# Patient Record
Sex: Male | Born: 1975
Health system: Southern US, Community
[De-identification: ages and names within clinical notes are randomized; demographics above are authoritative.]

---

## 1998-09-17 ENCOUNTER — Emergency Department (HOSPITAL_COMMUNITY): Admission: EM | Admit: 1998-09-17 | Discharge: 1998-09-17 | Payer: Self-pay

## 2000-09-24 ENCOUNTER — Emergency Department (HOSPITAL_COMMUNITY): Admission: EM | Admit: 2000-09-24 | Discharge: 2000-09-24 | Payer: Self-pay | Admitting: Emergency Medicine

## 2003-11-04 ENCOUNTER — Emergency Department (HOSPITAL_COMMUNITY): Admission: EM | Admit: 2003-11-04 | Discharge: 2003-11-04 | Payer: Self-pay | Admitting: Emergency Medicine

## 2005-12-26 ENCOUNTER — Emergency Department (HOSPITAL_COMMUNITY): Admission: EM | Admit: 2005-12-26 | Discharge: 2005-12-26 | Payer: Self-pay | Admitting: Emergency Medicine

## 2006-05-31 ENCOUNTER — Emergency Department (HOSPITAL_COMMUNITY): Admission: EM | Admit: 2006-05-31 | Discharge: 2006-05-31 | Payer: Self-pay | Admitting: Emergency Medicine

## 2008-06-10 ENCOUNTER — Emergency Department (HOSPITAL_COMMUNITY): Admission: EM | Admit: 2008-06-10 | Discharge: 2008-06-10 | Payer: Self-pay | Admitting: Emergency Medicine

## 2008-07-08 ENCOUNTER — Ambulatory Visit (HOSPITAL_BASED_OUTPATIENT_CLINIC_OR_DEPARTMENT_OTHER): Admission: RE | Admit: 2008-07-08 | Discharge: 2008-07-08 | Payer: Self-pay | Admitting: Orthopaedic Surgery

## 2010-08-03 NOTE — Op Note (Signed)
NAMETARRANCE, JANUSZEWSKI                ACCOUNT NO.:  [id number]   MEDICAL RECORD NO.:  [id number]          PATIENT TYPE:  AMB   LOCATION:                               FACILITY:  [facility]   PHYSICIAN:  Lubertha Basque. Dalldorf, M.D.DATE OF BIRTH:  [birth year]   DATE OF PROCEDURE:  07/08/2008  DATE OF DISCHARGE:                               OPERATIVE REPORT   PREOPERATIVE DIAGNOSIS:  Left ankle fracture.   POSTOPERATIVE DIAGNOSIS:  Left ankle fracture.   PROCEDURE:  Left ankle open reduction and internal fixation.   ANESTHESIA:  General.   ATTENDING SURGEON:  Lubertha Basque. Jerl Santos, MD   ASSISTANT:  Lindwood Qua, PA   INDICATIONS FOR PROCEDURE:  The patient is a 35 year old man about a  month from an ankle fracture.  This was a vertical medial malleolus  fracture which was relatively nondisplaced.  Despite nonweightbearing  status, his fracture line has actually worsened.  With his fracture  pattern, he is offered ORIF in hopes of getting this to heal.  He is  also encouraged to stop smoking.  Informed operative consent was  obtained after discussion of possible complications including reaction  to anesthesia, infection, and nonunion.   SUMMARY, FINDINGS, AND PROCEDURE:  Under general anesthesia through a  small medial incision, a vertical medial malleolus fracture was exposed  and stabilized with an antiglide plate, made with a one-third tubular  Synthes plate.  This was secured with 3 cancellous screws.  I used  fluoroscopy throughout the case to make appropriate intraoperative  decisions and read all these views myself.   DESCRIPTION OF PROCEDURE:  The patient was taken tot he operating suite  where general anesthetic was applied without difficulty.  He was  positioned supine and prepped, draped in a normal sterile fashion.  After administration of IV Kefzol, the left leg was elevated,  exsanguinated, and tourniquet was inflated about the calf.  A small  medial incision was made from  the medial malleolus proximal.  Dissection  was carried down through the subcutaneous tissues and periosteum to  expose the fracture site.  We then contoured an one-third tubular plate  to fit on this bone in antiglide fashion.  I secured 1 screw proximal to  the fracture site and then 2 distal compressing appropriately.  I used  fluoroscopy to confirm adequate placement of hardware and read these  views myself.  The wound was irrigated followed by release of  tourniquet.  A small amount of bleeding was easily controlled with some  pressure and Bovie cautery.  Subcutaneous tissues were reapproximated  with 2-0 undyed Vicryl and skin was closed with staples.  Adaptic was  applied after local anesthetic was injected.  We then placed a dry  dressing and a posterior splint of plaster with ankle in a neutral  position.  Estimated blood loss and intraoperative fluids can be  obtained from anesthesia records as can accurate tourniquet time.   DISPOSITION:  The patient was extubated in the operating room and taken  to the recovery room in stable addition.  He was to go home on same  day  and follow up in my office next week.  I will contact him by phone  tonight.      Lubertha Basque Jerl Santos, M.D.  Electronically Signed     PGD/MEDQ  D:  07/08/2008  T:  07/09/2008  Job:  [id number]

## 2013-03-21 HISTORY — PX: ANKLE SURGERY: SHX546

## 2013-09-14 ENCOUNTER — Emergency Department: Payer: Self-pay | Admitting: Internal Medicine

## 2014-09-10 ENCOUNTER — Ambulatory Visit (INDEPENDENT_AMBULATORY_CARE_PROVIDER_SITE_OTHER): Payer: Self-pay

## 2014-09-10 ENCOUNTER — Ambulatory Visit (INDEPENDENT_AMBULATORY_CARE_PROVIDER_SITE_OTHER): Payer: Self-pay | Admitting: Family Medicine

## 2014-09-10 VITALS — BP 126/72 | HR 72 | Temp 98.7°F | Resp 17 | Ht 70.5 in | Wt 207.2 lb

## 2014-09-10 DIAGNOSIS — M79641 Pain in right hand: Secondary | ICD-10-CM

## 2014-09-10 DIAGNOSIS — S62609A Fracture of unspecified phalanx of unspecified finger, initial encounter for closed fracture: Secondary | ICD-10-CM

## 2014-09-10 DIAGNOSIS — S80811A Abrasion, right lower leg, initial encounter: Secondary | ICD-10-CM

## 2014-09-10 MED ORDER — TRAMADOL HCL 50 MG PO TABS: 50.0000 mg | ORAL_TABLET | Freq: Three times a day (TID) | ORAL | Status: DC | PRN

## 2014-09-10 NOTE — Progress Notes (Signed)
Ulnar gutter splint application per Dr. Paralee Cancel verbal order. Deliah Boston, MS, PA-C   6:14 PM, 09/10/2014

## 2014-09-10 NOTE — Progress Notes (Signed)
Subjective:    Patient ID: Gary Burton, male    DOB: 1975/08/04, 39 y.o.   MRN: [id number] This chart was scribed for Meredith Staggers, MD by Littie Deeds, Medical Scribe. This patient was seen in Room 7 and the patient's care was started at 4:57 PM.    HPI HPI Comments: Gary Burton is a 39 y.o. right hand dominant male who presents to the [facility] for an injury, possible broken finger and abrasion on his right leg. Patient was playing softball last night when a softball struck his hand in the area between the 4th and 5th fingers. He has associated pain and swelling to the area. He has not tried any ice or medications for the pain. He is able to move his fingers, but with pain. Patient denies numbness. He has had prior injuries to both of his hands, including broken 4th knuckle of his right hand, but he has not had any prior finger injuries.  Patient also has an abrasion to his right leg due to sliding on the ground while playing softball last night. He does not have any difficulties moving the leg and is not concerned about the abrasion. He has applied an ointment to the area.  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  Review of Systems  Musculoskeletal: Positive for joint swelling and arthralgias.  Skin: Positive for wound.  Neurological: Negative for numbness.       Objective:   Physical Exam  Constitutional: He is oriented to person, place, and time. He appears well-developed and well-nourished. No distress.  HENT:  Head: Normocephalic and atraumatic.  Mouth/Throat: Oropharynx is clear and moist. No oropharyngeal exudate.  Eyes: Pupils are equal, round, and reactive to light.  Neck: Neck supple.  Cardiovascular: Normal rate.   Capillary refill < 1 second.  Pulmonary/Chest: Effort normal.  Musculoskeletal: He exhibits no edema.  Right hand: ecchymoses and swelling at base of 4th and 5th fingers, primarily at the base of the 5th and the distal  right 5th metacarpal. Able to move wrist without difficulty. No wrist bony tenderness. 1st-4th fingers non-tender. 1st-4th metacarpals non-tender. Tender along the right 5th finger. Able to flex and extend at the PIP, but guarded due to pain. Most tender over PIP proximal phalanx into the MCP of his 5th finger. Guarded at MCP and PIP of 5th finger.   Full ROM of right knee. Full strength of lower extremity, including ankle and foot.  Neurological: He is alert and oriented to person, place, and time. No cranial nerve deficit.  NVI distally.  Skin: Skin is warm and dry. No rash noted. No erythema.  Right lateral leg: 7 cm x 4 cm superficial abrasion with slight crust and dried blood centrally. No deep component. No wounds amenable to repair. No surrounding erythema of wound.  Fingertips warm. Skin intact.  Psychiatric: He has a normal mood and affect. His behavior is normal.  Vitals reviewed.  [facility] (PRIMARY) x-ray report read by Dr. Neva Seat: Right hand - spiral fracture which appears to extend to 20-30% of joint at MCP of the 5th finger without significant displacement.    Filed Vitals:   09/10/14 1622  BP: 126/72  Pulse: 72  Temp: 98.7 F (37.1 C)  TempSrc: Oral  Resp: 17  Height: 5' 10.5" (1.791 m)  Weight: 207 lb 3.2 oz (93.985 kg)  SpO2: 99%       Assessment & Plan:   Gary Burton is a  39 y.o. male Pain of right hand - Plan: DG Hand Complete Right, traMADol (ULTRAM) 50 MG tablet Finger fracture, right, closed, initial encounter - Plan: traMADol (ULTRAM) 50 MG tablet, Ambulatory referral to Hand Surgery  - oblique fracture with apparent involvement of MCP joint.  Due to risk of instability - refer to hand/ortho for eval and treatment. Placed in ulnar gutter splint, sx care discussed and tramadol if needed.   Abrasion, lower leg, anterior, right, initial encounter  - sx care, no sign of infection seen at present. RTC precautions.    Meds ordered this encounter  Medications    . traMADol (ULTRAM) 50 MG tablet    Sig: Take 1 tablet (50 mg total) by mouth every 8 (eight) hours as needed.    Dispense:  20 tablet    Refill:  0   Patient Instructions  For your finger fracture, I will refer you to hand surgeon. Ice on and off 15 minutes , elevate.  Ultram if needed.   Finger Fracture (Phalangeal) A broken bone of the finger (phalangealfracture) is a common injury for athletes. A single injury (trauma) is likely to fracture multiple bones on the same or different fingers. SYMPTOMS   Severe pain, at the time of injury.  Pain, tenderness, swelling, and later bruising of the finger and then the hand.  Visible deformity, if the fracture is complete and the bone fragments separate enough to distort the normal shape.  Numbness or coldness from swelling in the finger, causing pressure on blood vessels or nerves (uncommon). CAUSES  Direct or indirect injury (trauma) to the finger.  RISK INCREASES WITH:   Contact sports (football, rugby) or other sports where injury to the hand is likely (soccer, baseball, basketball).  Sports that require hitting (boxing, martial arts).  History of bone or joint disease, such as osteoporosis, or previous bone restraint.  Poor hand strength and flexibility. PREVENTION   For contact sports, wear appropriate and properly fitted protective equipment for the hand.  Learn and use proper technique when hitting, punching, or landing after a fall.  If you had a previous finger injury or hand restraint, use tape or padding to protect the finger when playing sports where finger injury is likely. PROGNOSIS  With proper treatment and normal alignment of the bones, healing can usually be expected in 4 to 6 weeks. Sometimes, surgery is needed.  RELATED COMPLICATIONS   Fracture does not heal (nonunion).  Bone heals in wrong position (malunion).  Chronic pain, stiffness, or swelling of the hand.  Excessive bleeding, causing pressure on  nerves and blood vessels.  Unstable or arthritic joint, following repeated injury or delayed treatment.  Hindrance of normal growth in children.  Infection in skin broken over the fracture (open fracture) or at the incision or pin sites from surgery.  Shortening of injured bones.  Bony bumps or loss of shape of the fingers.  Arthritic or stiff finger joint, if the fracture reaches the joint. TREATMENT  If the bones are properly aligned, treatment involves ice and medicine to reduce pain and inflammation. Then, the finger is restrained for 4 or more weeks, to allow for healing. If the fracture is out of alignment (displaced), involves more than one bone, or involves a joint, surgery is usually advised. Surgery often involves placing removable pins, screws, and sometimes plates, to hold the bones in proper alignment. After restraint (with or without surgery), stretching and strengthening exercises are needed. Exercises may be completed at home or with a  therapist. For certain sports, wearing a splint or having the finger taped during future activity is advised.  MEDICATION   If pain medicine is needed, nonsteroidal anti-inflammatory medicines (aspirin and ibuprofen), or other minor pain relievers (acetaminophen), are often advised.  Do not take pain medicine for 7 days before surgery.  Prescription pain relievers are usually prescribed only after surgery. Use only as directed and only as much as you need. COLD THERAPY   Cold treatment (icing) relieves pain and reduces inflammation. Cold treatment should be applied for 10 to 15 minutes every 2 to 3 hours, and immediately after activity that aggravates your symptoms. Use ice packs or an ice massage. SEEK MEDICAL CARE IF:   Pain, tenderness, or swelling gets worse, despite treatment.  You experience pain, numbness, or coldness in the hand.  Blue, gray, or dark color appears in the fingernails.  Any of the following occur after surgery:  fever, increased pain, swelling, redness, drainage of fluids, or bleeding in the affected area.  New, unexplained symptoms develop. (Drugs used in treatment may produce side effects.) Document Released: 03/07/2005 Document Revised: 05/30/2011 Document Reviewed: 06/19/2008 [facility] Patient Information 2015 [place], [place]. This information is not intended to replace advice given to you by your health care provider. Make sure you discuss any questions you have with your health care provider.    For your leg abrasion:  Abrasion An abrasion is a cut or scrape of the skin. Abrasions do not extend through all layers of the skin and most heal within 10 days. It is important to care for your abrasion properly to prevent infection. CAUSES  Most abrasions are caused by falling on, or gliding across, the ground or other surface. When your skin rubs on something, the outer and inner layer of skin rubs off, causing an abrasion. DIAGNOSIS  Your caregiver will be able to diagnose an abrasion during a physical exam.  TREATMENT  Your treatment depends on how large and deep the abrasion is. Generally, your abrasion will be cleaned with water and a mild soap to remove any dirt or debris. An antibiotic ointment may be put over the abrasion to prevent an infection. A bandage (dressing) may be wrapped around the abrasion to keep it from getting dirty.  You may need a tetanus shot if:  You cannot remember when you had your last tetanus shot.  You have never had a tetanus shot.  The injury broke your skin. If you get a tetanus shot, your arm may swell, get red, and feel warm to the touch. This is common and not a problem. If you need a tetanus shot and you choose not to have one, there is a rare chance of getting tetanus. Sickness from tetanus can be serious.  HOME CARE INSTRUCTIONS   If a dressing was applied, change it at least once a day or as directed by your caregiver. If the bandage sticks, soak it off  with warm water.   Wash the area with water and a mild soap to remove all the ointment 2 times a day. Rinse off the soap and pat the area dry with a clean towel.   Reapply any ointment as directed by your caregiver. This will help prevent infection and keep the bandage from sticking. Use gauze over the wound and under the dressing to help keep the bandage from sticking.   Change your dressing right away if it becomes wet or dirty.   Only take over-the-counter or prescription medicines for pain, discomfort,  or fever as directed by your caregiver.   Follow up with your caregiver within 24-48 hours for a wound check, or as directed. If you were not given a wound-check appointment, look closely at your abrasion for redness, swelling, or pus. These are signs of infection. SEEK IMMEDIATE MEDICAL CARE IF:   You have increasing pain in the wound.   You have redness, swelling, or tenderness around the wound.   You have pus coming from the wound.   You have a fever or persistent symptoms for more than 2-3 days.  You have a fever and your symptoms suddenly get worse.  You have a bad smell coming from the wound or dressing.  MAKE SURE YOU:   Understand these instructions.  Will watch your condition.  Will get help right away if you are not doing well or get worse. Document Released: 12/15/2004 Document Revised: 02/22/2012 Document Reviewed: 02/08/2011 [facility] Patient Information 2015 [place], [place]. This information is not intended to replace advice given to you by your health care provider. Make sure you discuss any questions you have with your health care provider.     I personally performed the services described in this documentation, which was scribed in my presence. The recorded information has been reviewed and considered, and addended by me as needed.

## 2014-09-10 NOTE — Patient Instructions (Addendum)
For your finger fracture, I will refer you to hand surgeon. Ice on and off 15 minutes , elevate.  Ultram if needed.   Finger Fracture (Phalangeal) A broken bone of the finger (phalangealfracture) is a common injury for athletes. A single injury (trauma) is likely to fracture multiple bones on the same or different fingers. SYMPTOMS   Severe pain, at the time of injury.  Pain, tenderness, swelling, and later bruising of the finger and then the hand.  Visible deformity, if the fracture is complete and the bone fragments separate enough to distort the normal shape.  Numbness or coldness from swelling in the finger, causing pressure on blood vessels or nerves (uncommon). CAUSES  Direct or indirect injury (trauma) to the finger.  RISK INCREASES WITH:   Contact sports (football, rugby) or other sports where injury to the hand is likely (soccer, baseball, basketball).  Sports that require hitting (boxing, martial arts).  History of bone or joint disease, such as osteoporosis, or previous bone restraint.  Poor hand strength and flexibility. PREVENTION   For contact sports, wear appropriate and properly fitted protective equipment for the hand.  Learn and use proper technique when hitting, punching, or landing after a fall.  If you had a previous finger injury or hand restraint, use tape or padding to protect the finger when playing sports where finger injury is likely. PROGNOSIS  With proper treatment and normal alignment of the bones, healing can usually be expected in 4 to 6 weeks. Sometimes, surgery is needed.  RELATED COMPLICATIONS   Fracture does not heal (nonunion).  Bone heals in wrong position (malunion).  Chronic pain, stiffness, or swelling of the hand.  Excessive bleeding, causing pressure on nerves and blood vessels.  Unstable or arthritic joint, following repeated injury or delayed treatment.  Hindrance of normal growth in children.  Infection in skin broken over  the fracture (open fracture) or at the incision or pin sites from surgery.  Shortening of injured bones.  Bony bumps or loss of shape of the fingers.  Arthritic or stiff finger joint, if the fracture reaches the joint. TREATMENT  If the bones are properly aligned, treatment involves ice and medicine to reduce pain and inflammation. Then, the finger is restrained for 4 or more weeks, to allow for healing. If the fracture is out of alignment (displaced), involves more than one bone, or involves a joint, surgery is usually advised. Surgery often involves placing removable pins, screws, and sometimes plates, to hold the bones in proper alignment. After restraint (with or without surgery), stretching and strengthening exercises are needed. Exercises may be completed at home or with a therapist. For certain sports, wearing a splint or having the finger taped during future activity is advised.  MEDICATION   If pain medicine is needed, nonsteroidal anti-inflammatory medicines (aspirin and ibuprofen), or other minor pain relievers (acetaminophen), are often advised.  Do not take pain medicine for 7 days before surgery.  Prescription pain relievers are usually prescribed only after surgery. Use only as directed and only as much as you need. COLD THERAPY   Cold treatment (icing) relieves pain and reduces inflammation. Cold treatment should be applied for 10 to 15 minutes every 2 to 3 hours, and immediately after activity that aggravates your symptoms. Use ice packs or an ice massage. SEEK MEDICAL CARE IF:   Pain, tenderness, or swelling gets worse, despite treatment.  You experience pain, numbness, or coldness in the hand.  Blue, gray, or dark color appears in the  fingernails.  Any of the following occur after surgery: fever, increased pain, swelling, redness, drainage of fluids, or bleeding in the affected area.  New, unexplained symptoms develop. (Drugs used in treatment may produce side  effects.) Document Released: 03/07/2005 Document Revised: 05/30/2011 Document Reviewed: 06/19/2008 [facility] Patient Information 2015 [place], [place]. This information is not intended to replace advice given to you by your health care provider. Make sure you discuss any questions you have with your health care provider.    For your leg abrasion:  Abrasion An abrasion is a cut or scrape of the skin. Abrasions do not extend through all layers of the skin and most heal within 10 days. It is important to care for your abrasion properly to prevent infection. CAUSES  Most abrasions are caused by falling on, or gliding across, the ground or other surface. When your skin rubs on something, the outer and inner layer of skin rubs off, causing an abrasion. DIAGNOSIS  Your caregiver will be able to diagnose an abrasion during a physical exam.  TREATMENT  Your treatment depends on how large and deep the abrasion is. Generally, your abrasion will be cleaned with water and a mild soap to remove any dirt or debris. An antibiotic ointment may be put over the abrasion to prevent an infection. A bandage (dressing) may be wrapped around the abrasion to keep it from getting dirty.  You may need a tetanus shot if:  You cannot remember when you had your last tetanus shot.  You have never had a tetanus shot.  The injury broke your skin. If you get a tetanus shot, your arm may swell, get red, and feel warm to the touch. This is common and not a problem. If you need a tetanus shot and you choose not to have one, there is a rare chance of getting tetanus. Sickness from tetanus can be serious.  HOME CARE INSTRUCTIONS   If a dressing was applied, change it at least once a day or as directed by your caregiver. If the bandage sticks, soak it off with warm water.   Wash the area with water and a mild soap to remove all the ointment 2 times a day. Rinse off the soap and pat the area dry with a clean towel.   Reapply  any ointment as directed by your caregiver. This will help prevent infection and keep the bandage from sticking. Use gauze over the wound and under the dressing to help keep the bandage from sticking.   Change your dressing right away if it becomes wet or dirty.   Only take over-the-counter or prescription medicines for pain, discomfort, or fever as directed by your caregiver.   Follow up with your caregiver within 24-48 hours for a wound check, or as directed. If you were not given a wound-check appointment, look closely at your abrasion for redness, swelling, or pus. These are signs of infection. SEEK IMMEDIATE MEDICAL CARE IF:   You have increasing pain in the wound.   You have redness, swelling, or tenderness around the wound.   You have pus coming from the wound.   You have a fever or persistent symptoms for more than 2-3 days.  You have a fever and your symptoms suddenly get worse.  You have a bad smell coming from the wound or dressing.  MAKE SURE YOU:   Understand these instructions.  Will watch your condition.  Will get help right away if you are not doing well or get worse. Document  Released: 12/15/2004 Document Revised: 02/22/2012 Document Reviewed: 02/08/2011 [facility] Patient Information 2015 [place], [place]. This information is not intended to replace advice given to you by your health care provider. Make sure you discuss any questions you have with your health care provider.

## 2017-01-19 ENCOUNTER — Ambulatory Visit: Payer: Self-pay | Admitting: Family Medicine

## 2017-01-19 ENCOUNTER — Ambulatory Visit (INDEPENDENT_AMBULATORY_CARE_PROVIDER_SITE_OTHER): Payer: 59 | Admitting: Physician Assistant

## 2017-01-19 VITALS — BP 124/78 | HR 73 | Temp 98.1°F | Resp 16 | Ht 70.0 in | Wt 212.0 lb

## 2017-01-19 DIAGNOSIS — N5082 Scrotal pain: Secondary | ICD-10-CM | POA: Diagnosis not present

## 2017-01-19 LAB — POCT URINALYSIS DIP (MANUAL ENTRY)
Bilirubin, UA: NEGATIVE
Blood, UA: NEGATIVE
Glucose, UA: NEGATIVE mg/dL
LEUKOCYTES UA: NEGATIVE
NITRITE UA: NEGATIVE
PH UA: 5.5 (ref 5.0–8.0)
PROTEIN UA: NEGATIVE mg/dL
Spec Grav, UA: >=1.03 — AB (ref 1.010–1.025)
UROBILINOGEN UA: 0.2 U/dL

## 2017-01-19 NOTE — Progress Notes (Signed)
[facility] AT [facility] [address], [place] [id number] [phone]  Date:  01/19/2017   Name:  Gary SawyerBrian J Hipps   DOB:  1975-10-24   MRN:  [id number]  PCP:  Patient, No Pcp Per    History of Present Illness:  Gary Burton is a 41 y.o. male patient who presents to PCP with  Chief Complaint  Patient presents with  . Testicle Pain    left side/ x 2 days, pt states he has a small knot on his testicle but not enlarged     2 days ago, he found a knot on his testicle.  Feels like a pea on the left side of testicle.  It is not swollen or enlarged.  There is pain when he touches it.  Underneath the sac, there is painful.  There is some pain at the right side of his groin.     Mother with breast cancer.     There are no active problems to display for this patient.   No past medical history on file.  Past Surgical History:  Procedure Laterality Date  . ANKLE SURGERY  2015   left ankle     Social History  Substance Use Topics  . Smoking status: Current Every Day Smoker    Packs/day: 0.50    Types: Cigarettes  . Smokeless tobacco: Not on file  . Alcohol use Not on file    No family history on file.  No Known Allergies  Medication list has been reviewed and updated.  No current outpatient prescriptions on file prior to visit.   No current facility-administered medications on file prior to visit.     ROS ROS otherwise unremarkable unless listed above.  Physical Examination: BP 124/78   Pulse 73   Temp 98.1 F (36.7 C) (Oral)   Resp 16   Ht 5\' 10"  (1.778 m)   Wt 212 lb (96.2 kg)   SpO2 97%   BMI 30.42 kg/m  Ideal Body Weight: Weight in (lb) to have BMI = 25: 173.9  Physical Exam  Constitutional: He is oriented to person, place, and time. He appears well-developed and well-nourished. No distress.  HENT:  Head: Normocephalic and atraumatic.  Eyes: Conjunctivae and EOM are normal. Pupils are equal, round, and reactive to light.  Cardiovascular: Normal  rate.  Pulmonary/Chest: Effort normal. No respiratory distress.  Genitourinary: Penis normal. Right testis shows no swelling. Left testis shows no swelling.  Genitourinary Comments: Testicular tenderness is minimal.  At the superior area of the testicle at the epididymis is a tender nodule.  Rubbery in form.    Neurological: He is alert and oriented to person, place, and time.  Skin: Skin is warm and dry. He is not diaphoretic.  Psychiatric: He has a normal mood and affect. His behavior is normal.     Assessment and Plan: Gary SawyerBrian J Sypher is a 41 y.o. male who is here today for cc of  Chief Complaint  Patient presents with  . Testicle Pain    left side/ x 2 days, pt states he has a small knot on his testicle but not enlarged   Scrotum pain - Plan: US Scrotum, GC/Chlamydia Probe Amp, POCT urinalysis dipstick  Trena PlattStephanie Shivan Hodes, PA-C [facility] 11/6/201811:37 AM

## 2017-01-19 NOTE — Patient Instructions (Addendum)
  I will contact you to get the ultrasound asap. Please await referral for urology.      IF you received an x-ray today, you will receive an invoice from [facility]. Please contact [facility] at [phone] with questions or concerns regarding your invoice.   IF you received labwork today, you will receive an invoice from [facility]. Please contact [facility] at [phone] with questions or concerns regarding your invoice.   Our billing staff will not be able to assist you with questions regarding bills from these companies.  You will be contacted with the lab results as soon as they are available. The fastest way to get your results is to activate your My Chart account. Instructions are located on the last page of this paperwork. If you have not heard from us regarding the results in 2 weeks, please contact this office.

## 2017-01-20 LAB — GC/CHLAMYDIA PROBE AMP
CHLAMYDIA, DNA PROBE: NEGATIVE
Neisseria gonorrhoeae by PCR: NEGATIVE

## 2017-01-24 ENCOUNTER — Telehealth: Payer: Self-pay | Admitting: Physician Assistant

## 2017-01-24 NOTE — Telephone Encounter (Signed)
Can I get this scheduled and performed please.

## 2017-01-24 NOTE — Telephone Encounter (Signed)
Just faxed order to [facility] they should have it before this afternoon

## 2017-02-01 ENCOUNTER — Other Ambulatory Visit: Payer: Self-pay

## 2017-02-01 ENCOUNTER — Ambulatory Visit (INDEPENDENT_AMBULATORY_CARE_PROVIDER_SITE_OTHER): Payer: 59 | Admitting: Emergency Medicine

## 2017-02-01 ENCOUNTER — Encounter: Payer: Self-pay | Admitting: *Deleted

## 2017-02-01 ENCOUNTER — Encounter: Payer: Self-pay | Admitting: Emergency Medicine

## 2017-02-01 ENCOUNTER — Telehealth: Payer: Self-pay | Admitting: General Practice

## 2017-02-01 VITALS — BP 124/70 | HR 70 | Temp 98.6°F | Resp 16 | Ht 68.75 in | Wt 212.8 lb

## 2017-02-01 DIAGNOSIS — N50812 Left testicular pain: Secondary | ICD-10-CM

## 2017-02-01 DIAGNOSIS — R5383 Other fatigue: Secondary | ICD-10-CM | POA: Diagnosis not present

## 2017-02-01 MED ORDER — CIPROFLOXACIN HCL 500 MG PO TABS: 500.0000 mg | ORAL_TABLET | Freq: Two times a day (BID) | ORAL | 0 refills | Status: AC

## 2017-02-01 NOTE — Telephone Encounter (Signed)
Pt had gone to [place] for discomfort and swelling in his groin. Now the discomfort has increased. Appointment made.

## 2017-02-01 NOTE — Patient Instructions (Addendum)
Scrotal ultrasound tomorrow as scheduled. Cipro as prescribed. Follow up with Urologist.   IF you received an x-ray today, you will receive an invoice from [facility]. Please contact [facility] at [phone] with questions or concerns regarding your invoice.   IF you received labwork today, you will receive an invoice from [facility]. Please contact [facility] at [phone] with questions or concerns regarding your invoice.   Our billing staff will not be able to assist you with questions regarding bills from these companies.  You will be contacted with the lab results as soon as they are available. The fastest way to get your results is to activate your My Chart account. Instructions are located on the last page of this paperwork. If you have not heard from us regarding the results in 2 weeks, please contact this office.     Scrotal Swelling Scrotal swelling may occur on one or both sides of the scrotum. Pain may also occur with swelling. Possible causes of scrotal swelling include:  Injury.  Infection.  An ingrown hair or abrasion in the area.  Repeated rubbing from tight-fitting underwear.  Poor hygiene.  A weakened area in the muscles around the groin (hernia). A hernia can allow abdominal contents to push into the scrotum.  Fluid around the testicle (hydrocele).  Enlarged vein around the testicle (varicocele).  Certain medical treatments or existing conditions.  A recent genital surgery or procedure.  The spermatic cord becomes twisted in the scrotum, which cuts off blood supply (testicular torsion).  Testicular cancer.  Follow these instructions at home: Once the cause of your scrotal swelling has been determined, you may be asked to monitor your scrotum for any changes. The following actions may help to alleviate any discomfort you are experiencing:  Rest and limit activity until the swelling goes away. Lying down is the preferred  position.  Put ice on the scrotum: ? Put ice in a plastic bag. ? Place a towel between your skin and the bag. ? Leave the ice on for 20 minutes, 2-3 times a day for 1-2 days.  Place a rolled towel under the testicles for support.  Wear loose-fitting clothing or an athletic support cup for comfort.  Take all medicines as directed by your health care provider.  Perform a monthly self-exam of the scrotum and penis. Feel for changes. Ask your health care provider how to perform a monthly self-exam if you are unsure.  Contact a health care provider if:  You have a sudden (acute) onset of pain that is persistent and not improving.  You notice a heavy feeling or fluid in the scrotum.  You have pain or burning while urinating.  You have blood in the urine or semen.  You feel a lump around the testicle.  You notice that one testicle is larger than the other (slight variation is normal).  You have a persistent dull ache or pain in the groin or scrotum. Get help right away if:  The pain does not go away or becomes severe.  You have a fever or shaking chills.  You have pain or vomiting that cannot be controlled.  You notice significant redness or swelling of one or both sides of the scrotum.  You experience redness spreading upward from your scrotum to your abdomen or downward from your scrotum to your thighs. This information is not intended to replace advice given to you by your health care provider. Make sure you discuss any questions you have with your health care provider.  Document Released: 04/09/2010 Document Revised: 09/25/2015 Document Reviewed: 08/09/2012 Elsevier Interactive Patient Education  [employer]2018 Elsevier Inc.

## 2017-02-01 NOTE — Progress Notes (Signed)
Gary Burton 41 y.o.   Chief Complaint  Patient presents with  . Testicle Pain     per patient -has gotten worse with penis numbness this AM    HISTORY OF PRESENT ILLNESS: This is a 41 y.o. male seen here 01/19/17 for left testicular lump; has increased in size and it hurts now; this am had episode of numbness (gone now); denies urinary symptoms, hematuria; no problems with erection; no fever, n/v, dysuria, or trauma.  Testicle Pain  The patient's primary symptoms include testicular pain. This is a new problem. The current episode started 1 to 4 weeks ago. The problem occurs constantly. The problem has been gradually worsening. The pain is medium. Pertinent negatives include no abdominal pain, chills, diarrhea, dysuria, fever, flank pain, frequency, headaches, hematuria, nausea, rash, shortness of breath, urgency or vomiting. The testicular pain affects the left testicle. There is swelling in the left testicle. The color of the testicles is normal. The symptoms are aggravated by activity. He has tried nothing for the symptoms.     Prior to Admission medications   Not on File    Allergies  Allergen Reactions  . Amoxapine And Related Other (See Comments)    FOR TOOTH AND MADE MATTERS WORSE    There are no active problems to display for this patient.   No past medical history on file.  Past Surgical History:  Procedure Laterality Date  . ANKLE SURGERY  2015   left ankle     Social History   Socioeconomic History  . Marital status: Single    Spouse name: Not on file  . Number of children: Not on file  . Years of education: Not on file  . Highest education level: Not on file  Social Needs  . Financial resource strain: Not on file  . Food insecurity - worry: Not on file  . Food insecurity - inability: Not on file  . Transportation needs - medical: Not on file  . Transportation needs - non-medical: Not on file  Occupational History  . Not on file  Tobacco Use  .  Smoking status: Current Every Day Smoker    Packs/day: 0.50    Types: Cigarettes  . Smokeless tobacco: Current User  Substance and Sexual Activity  . Alcohol use: Yes    Alcohol/week: 7.2 oz    Types: 12 Cans of beer per week  . Drug use: No  . Sexual activity: Not on file  Other Topics Concern  . Not on file  Social History Narrative  . Not on file    No family history on file.   Review of Systems  Constitutional: Positive for malaise/fatigue. Negative for chills and fever.  Respiratory: Negative.  Negative for shortness of breath.   Cardiovascular: Negative.  Negative for leg swelling.  Gastrointestinal: Negative.  Negative for abdominal pain, blood in stool, diarrhea, nausea and vomiting.  Genitourinary: Positive for testicular pain. Negative for dysuria, flank pain, frequency, hematuria and urgency.  Skin: Negative.  Negative for rash.  Neurological: Negative for dizziness and headaches.  Endo/Heme/Allergies: Negative.   All other systems reviewed and are negative.  Vitals:   02/01/17 1503  BP: 124/70  Pulse: 70  Resp: 16  Temp: 98.6 F (37 C)  SpO2: 98%     Physical Exam  Constitutional: He is oriented to person, place, and time. He appears well-developed and well-nourished.  HENT:  Head: Normocephalic and atraumatic.  Nose: Nose normal.  Eyes: EOM are normal. Pupils are equal,  round, and reactive to light.  Neck: Normal range of motion. Neck supple.  Cardiovascular: Normal rate, regular rhythm, normal heart sounds and intact distal pulses.  Pulmonary/Chest: Effort normal and breath sounds normal.  Abdominal: Soft. Bowel sounds are normal. He exhibits no distension. There is no tenderness.  Genitourinary: Penis normal. Right testis shows no mass, no swelling and no tenderness. Left testis shows tenderness. Uncircumcised. No discharge found.  Genitourinary Comments: Tender lump top of left testicle, possible varicocele. Tender right inguinal ligament.    Musculoskeletal: Normal range of motion.  Neurological: He is alert and oriented to person, place, and time.  Skin: Skin is warm and dry. Capillary refill takes less than 2 seconds.  Psychiatric: He has a normal mood and affect. His behavior is normal.  Vitals reviewed.    ASSESSMENT & PLAN: Gary Burton was seen today for testicle pain.  Diagnoses and all orders for this visit:  Pain in left testicle -     Urine Culture -     CBC with Differential/Platelet -     Comprehensive metabolic panel -     PSA -     Ambulatory referral to Urology -     ciprofloxacin (CIPRO) 500 MG tablet; Take 1 tablet (500 mg total) 2 (two) times daily for 7 days by mouth. -     Testosterone  Fatigue, unspecified type -     Testosterone -     CK    Patient Instructions    Scrotal ultrasound tomorrow as scheduled. Cipro as prescribed. Follow up with Urologist.   IF you received an x-ray today, you will receive an invoice from [facility]. Please contact [facility] at [phone] with questions or concerns regarding your invoice.   IF you received labwork today, you will receive an invoice from [facility]. Please contact [facility] at [phone] with questions or concerns regarding your invoice.   Our billing staff will not be able to assist you with questions regarding bills from these companies.  You will be contacted with the lab results as soon as they are available. The fastest way to get your results is to activate your My Chart account. Instructions are located on the last page of this paperwork. If you have not heard from us regarding the results in 2 weeks, please contact this office.     Scrotal Swelling Scrotal swelling may occur on one or both sides of the scrotum. Pain may also occur with swelling. Possible causes of scrotal swelling include:  Injury.  Infection.  An ingrown hair or abrasion in the area.  Repeated rubbing from tight-fitting underwear.  Poor  hygiene.  A weakened area in the muscles around the groin (hernia). A hernia can allow abdominal contents to push into the scrotum.  Fluid around the testicle (hydrocele).  Enlarged vein around the testicle (varicocele).  Certain medical treatments or existing conditions.  A recent genital surgery or procedure.  The spermatic cord becomes twisted in the scrotum, which cuts off blood supply (testicular torsion).  Testicular cancer.  Follow these instructions at home: Once the cause of your scrotal swelling has been determined, you may be asked to monitor your scrotum for any changes. The following actions may help to alleviate any discomfort you are experiencing:  Rest and limit activity until the swelling goes away. Lying down is the preferred position.  Put ice on the scrotum: ? Put ice in a plastic bag. ? Place a towel between your skin and the bag. ? Leave the ice  on for 20 minutes, 2-3 times a day for 1-2 days.  Place a rolled towel under the testicles for support.  Wear loose-fitting clothing or an athletic support cup for comfort.  Take all medicines as directed by your health care provider.  Perform a monthly self-exam of the scrotum and penis. Feel for changes. Ask your health care provider how to perform a monthly self-exam if you are unsure.  Contact a health care provider if:  You have a sudden (acute) onset of pain that is persistent and not improving.  You notice a heavy feeling or fluid in the scrotum.  You have pain or burning while urinating.  You have blood in the urine or semen.  You feel a lump around the testicle.  You notice that one testicle is larger than the other (slight variation is normal).  You have a persistent dull ache or pain in the groin or scrotum. Get help right away if:  The pain does not go away or becomes severe.  You have a fever or shaking chills.  You have pain or vomiting that cannot be controlled.  You notice  significant redness or swelling of one or both sides of the scrotum.  You experience redness spreading upward from your scrotum to your abdomen or downward from your scrotum to your thighs. This information is not intended to replace advice given to you by your health care provider. Make sure you discuss any questions you have with your health care provider. Document Released: 04/09/2010 Document Revised: 09/25/2015 Document Reviewed: 08/09/2012 Elsevier Interactive Patient Education  2018 Elsevier Inc.      Edwina BarthMiguel Reata Petrov, MD [facility]

## 2017-02-01 NOTE — Telephone Encounter (Signed)
This encounter was created in error - please disregard.

## 2017-02-02 ENCOUNTER — Ambulatory Visit
Admission: RE | Admit: 2017-02-02 | Discharge: 2017-02-02 | Disposition: A | Discharge status: Home / Self Care | Payer: 59 | Source: Ambulatory Visit | Attending: Physician Assistant | Admitting: Physician Assistant

## 2017-02-02 ENCOUNTER — Other Ambulatory Visit: Payer: Self-pay | Admitting: Physician Assistant

## 2017-02-02 DIAGNOSIS — N5082 Scrotal pain: Secondary | ICD-10-CM

## 2017-02-02 DIAGNOSIS — I861 Scrotal varices: Secondary | ICD-10-CM | POA: Diagnosis not present

## 2017-02-02 LAB — COMPREHENSIVE METABOLIC PANEL
ALT: 32 IU/L (ref 0–44)
AST: 20 IU/L (ref 0–40)
Albumin/Globulin Ratio: 1.9 (ref 1.2–2.2)
Albumin: 4.6 g/dL (ref 3.5–5.5)
Alkaline Phosphatase: 70 IU/L (ref 39–117)
BUN/Creatinine Ratio: 14 (ref 9–20)
BUN: 14 mg/dL (ref 6–24)
Bilirubin Total: <0.2 mg/dL (ref 0.0–1.2)
CALCIUM: 9.7 mg/dL (ref 8.7–10.2)
CO2: 23 mmol/L (ref 20–29)
CREATININE: 1.03 mg/dL (ref 0.76–1.27)
Chloride: 102 mmol/L (ref 96–106)
GFR calc Af Amer: 103 mL/min/{1.73_m2} (ref >59)
GFR, EST NON AFRICAN AMERICAN: 89 mL/min/{1.73_m2} (ref >59)
GLOBULIN, TOTAL: 2.4 g/dL (ref 1.5–4.5)
Glucose: 83 mg/dL (ref 65–99)
Potassium: 4.1 mmol/L (ref 3.5–5.2)
SODIUM: 140 mmol/L (ref 134–144)
Total Protein: 7 g/dL (ref 6.0–8.5)

## 2017-02-02 LAB — CBC WITH DIFFERENTIAL/PLATELET
Basophils Absolute: 0 10*3/uL (ref 0.0–0.2)
Basos: 1 %
EOS (ABSOLUTE): 0.1 10*3/uL (ref 0.0–0.4)
EOS: 1 %
HEMATOCRIT: 46.4 % (ref 37.5–51.0)
HEMOGLOBIN: 15.8 g/dL (ref 13.0–17.7)
IMMATURE GRANULOCYTES: 0 %
Immature Grans (Abs): 0 10*3/uL (ref 0.0–0.1)
LYMPHS: 33 %
Lymphocytes Absolute: 2.4 10*3/uL (ref 0.7–3.1)
MCH: 31.3 pg (ref 26.6–33.0)
MCHC: 34.1 g/dL (ref 31.5–35.7)
MCV: 92 fL (ref 79–97)
MONOCYTES: 10 %
MONOS ABS: 0.7 10*3/uL (ref 0.1–0.9)
NEUTROS PCT: 55 %
Neutrophils Absolute: 3.9 10*3/uL (ref 1.4–7.0)
Platelets: 210 10*3/uL (ref 150–379)
RBC: 5.05 x10E6/uL (ref 4.14–5.80)
RDW: 13.5 % (ref 12.3–15.4)
WBC: 7.1 10*3/uL (ref 3.4–10.8)

## 2017-02-02 LAB — TESTOSTERONE: TESTOSTERONE: 217 ng/dL — AB (ref 264–916)

## 2017-02-02 LAB — URINE CULTURE: Organism ID, Bacteria: NO GROWTH

## 2017-02-02 LAB — CK: CK TOTAL: 163 U/L (ref 24–204)

## 2017-02-02 LAB — PSA: Prostate Specific Ag, Serum: 1 ng/mL (ref 0.0–4.0)

## 2017-02-03 ENCOUNTER — Encounter: Payer: Self-pay | Admitting: Radiology

## 2017-02-06 ENCOUNTER — Telehealth: Payer: Self-pay | Admitting: Emergency Medicine

## 2017-02-06 NOTE — Telephone Encounter (Signed)
Copied from CRM #[id number]. Topic: Quick Communication - Lab Results >> Feb 06, 2017  2:29 PM Guinevere FerrariMorris, Mckayla Mulcahey E, NT wrote: Pt. Is calling to see if his lab results are in.

## 2017-02-07 ENCOUNTER — Encounter: Payer: Self-pay | Admitting: Physician Assistant

## 2017-02-07 NOTE — Telephone Encounter (Signed)
Patient informed of results and referral that was placed.

## 2017-03-30 IMAGING — CR DG HAND COMPLETE 3+V*R*
3 series · 3 of 3 positions shown · non-contrast
Comparison: None.

CLINICAL DATA: Injury to the fifth finger. Pain in the fifth digit
and fifth metacarpal.

EXAM:
RIGHT HAND - COMPLETE 3+ VIEW

[PA]
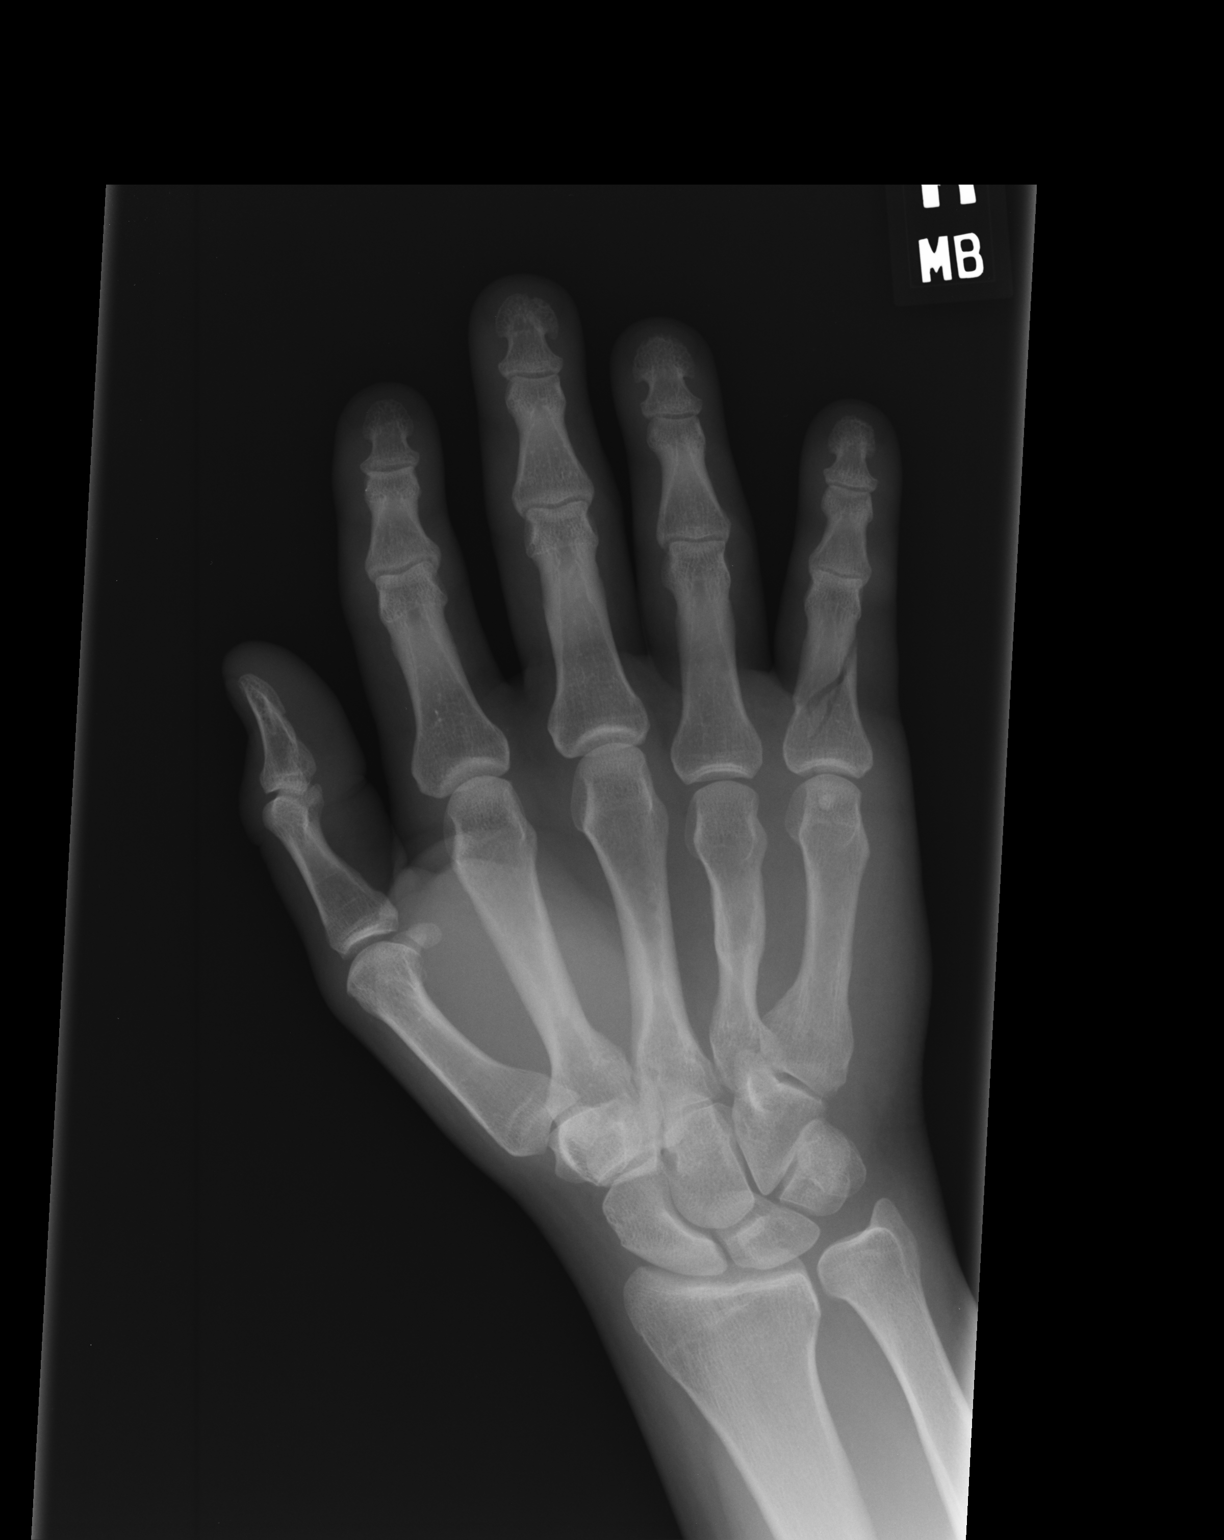

[lateral]
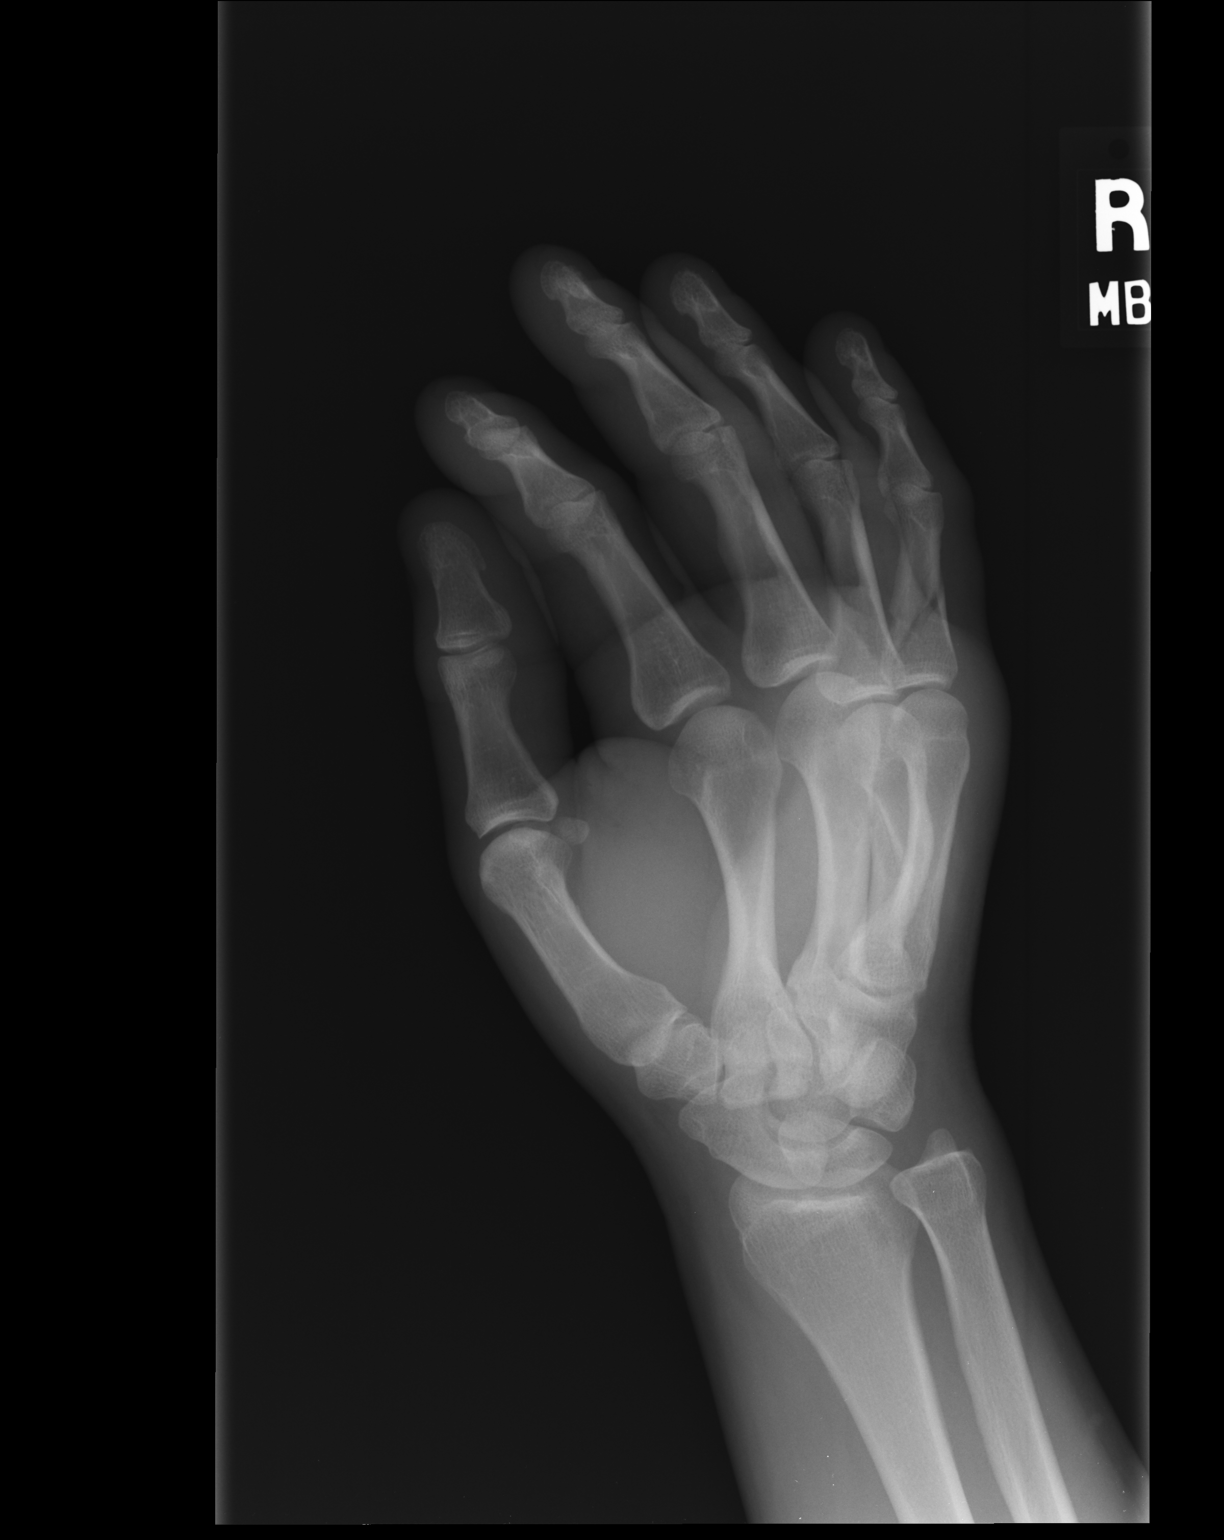

[pa obl]
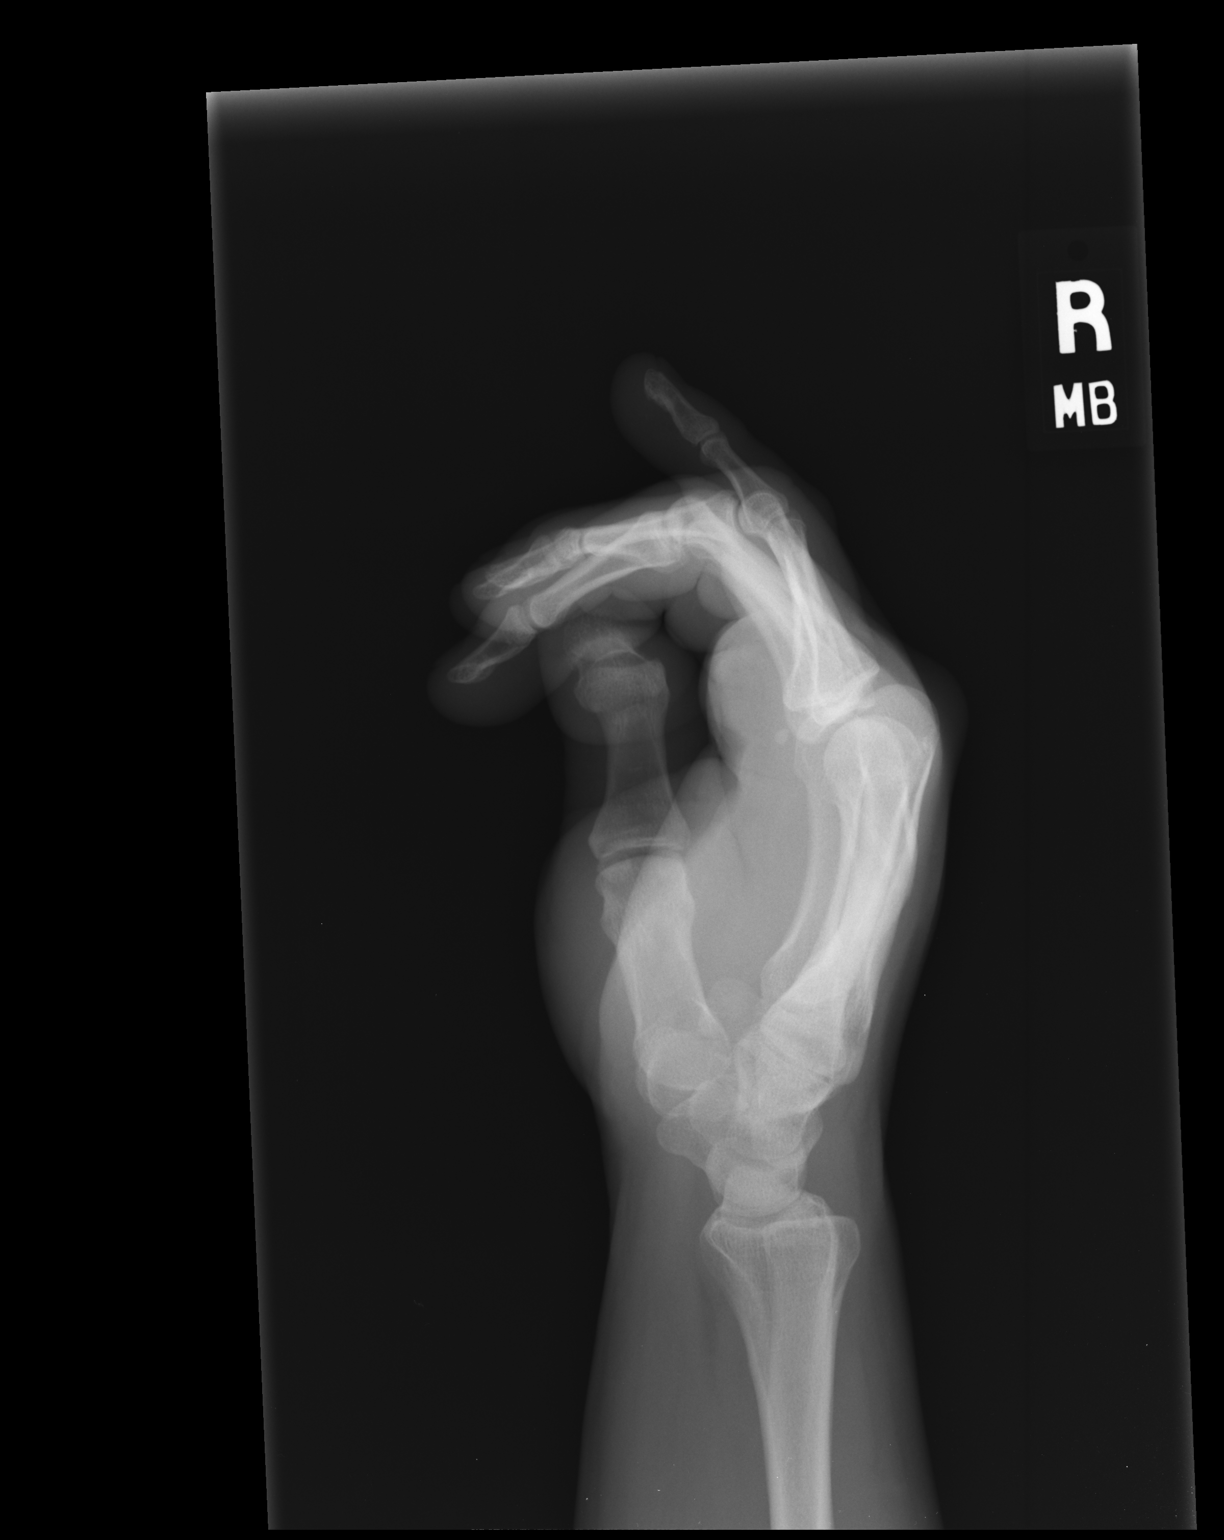

[3 of 3 positions shown; findings below may reference images not displayed]

FINDINGS: An oblique fracture is present a proximal phalanx of the fifth
digit. This extends to the articular surface of the MCP joint. The
fracture is not significantly displaced. No additional fractures are
present. Associated soft tissue swelling is noted.
IMPRESSION: 1. Oblique nondisplaced fracture in the proximal phalanx of the
fifth digit extends to the MCP joint.

## 2017-04-10 DIAGNOSIS — I861 Scrotal varices: Secondary | ICD-10-CM | POA: Diagnosis not present

## 2017-04-10 DIAGNOSIS — E291 Testicular hypofunction: Secondary | ICD-10-CM | POA: Diagnosis not present

## 2017-06-28 ENCOUNTER — Encounter: Payer: Self-pay | Admitting: Physician Assistant

## 2017-11-14 DIAGNOSIS — M25469 Effusion, unspecified knee: Secondary | ICD-10-CM | POA: Diagnosis not present

## 2017-11-14 DIAGNOSIS — M25561 Pain in right knee: Secondary | ICD-10-CM | POA: Diagnosis not present

## 2018-07-17 DIAGNOSIS — B360 Pityriasis versicolor: Secondary | ICD-10-CM | POA: Diagnosis not present

## 2018-12-07 IMAGING — US US SCROTUM W/ DOPPLER COMPLETE
1 series · 14 of 25 positions shown · non-contrast
Comparison: None.

CLINICAL DATA: 40-year-old male with left scrotal nodule identified
on physical exam.

EXAM:
SCROTAL ULTRASOUND
DOPPLER ULTRASOUND OF THE TESTICLES
TECHNIQUE: Complete ultrasound examination of the testicles, epididymis, and
other scrotal structures was performed. Color and spectral Doppler
ultrasound were also utilized to evaluate blood flow to the
testicles.

[Series 1: us scrotum w/ doppler complete · 0.07mm/px · 14 of 62 slices shown]
[im 1/62]
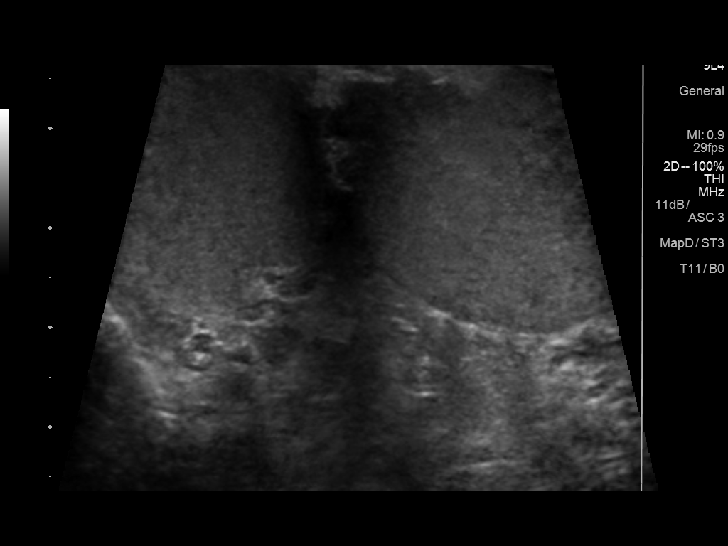
[im 6/62]
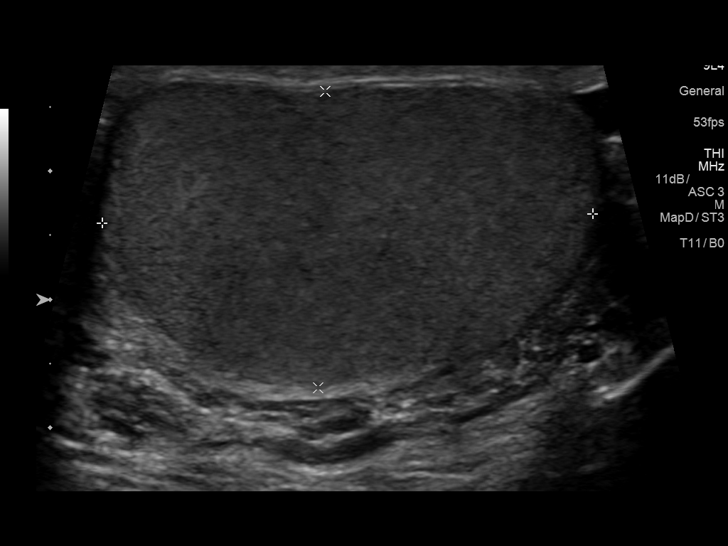
[im 11/62]
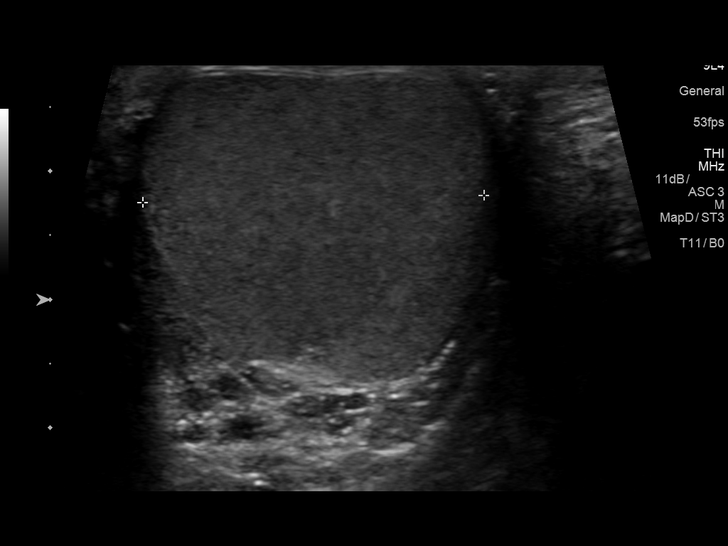
[im 16/62]
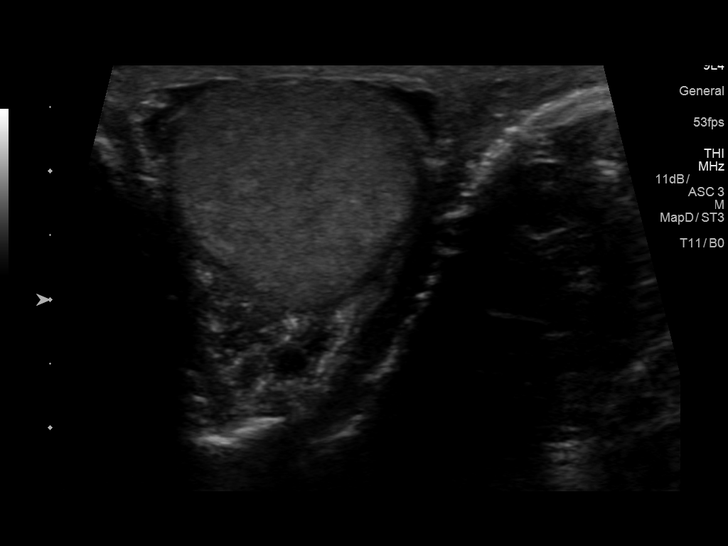
[im 21/62]
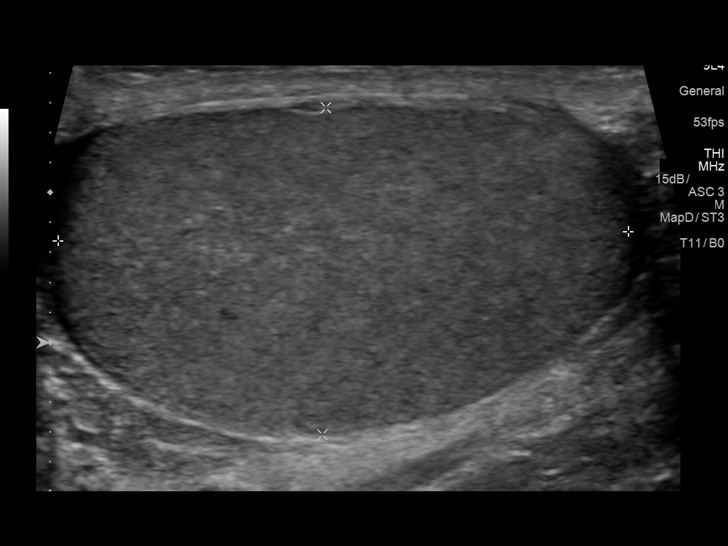
[im 23/62]
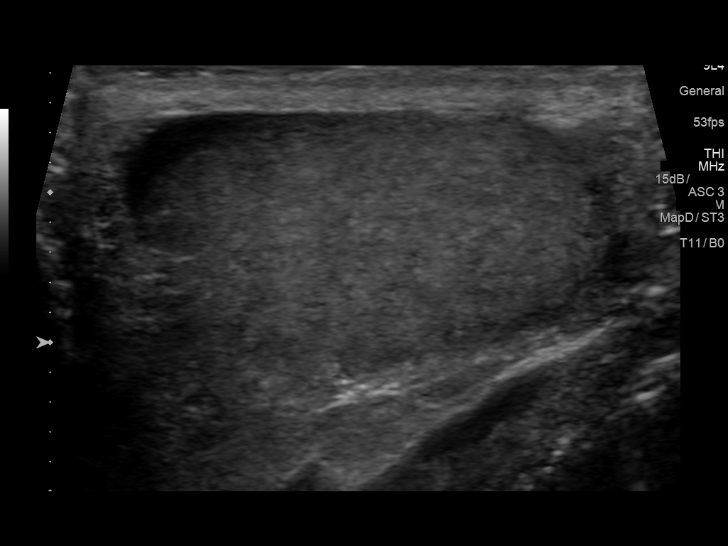
[im 28/62]
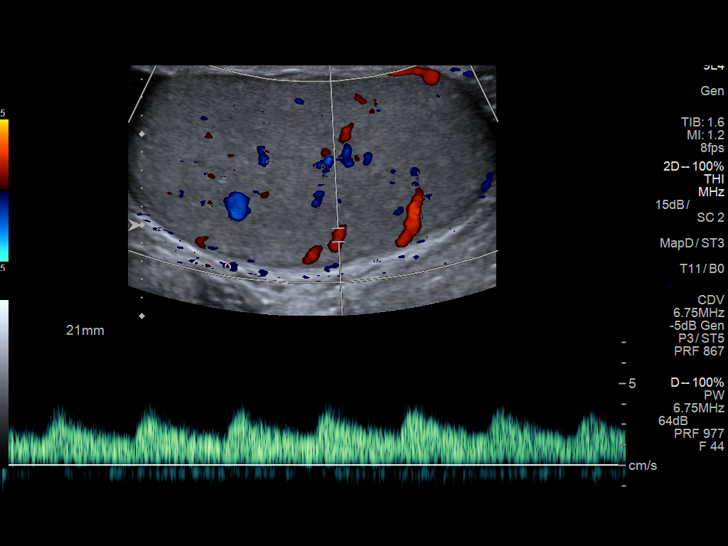
[im 34/62]
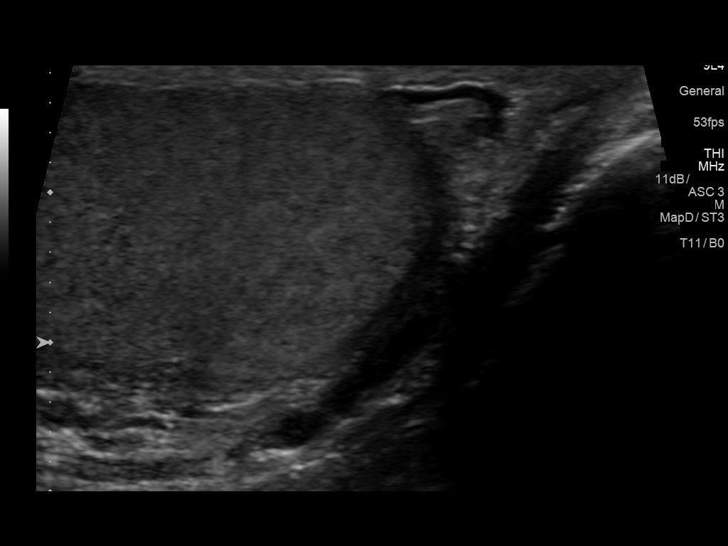
[im 39/62]
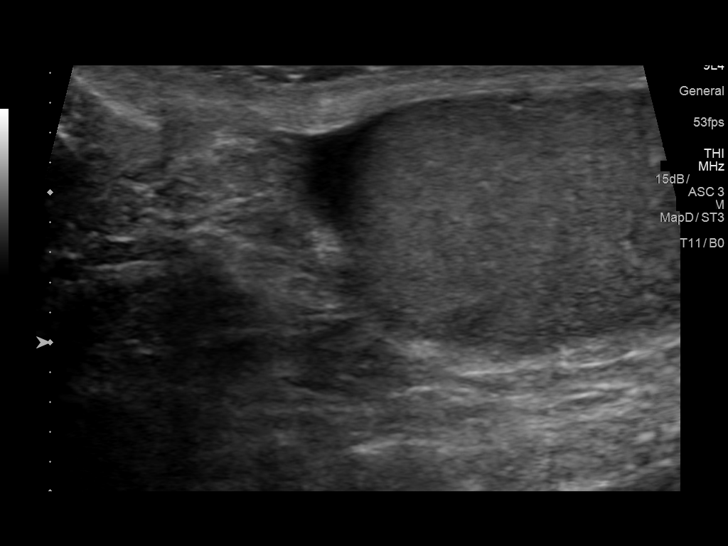
[im 41/62]
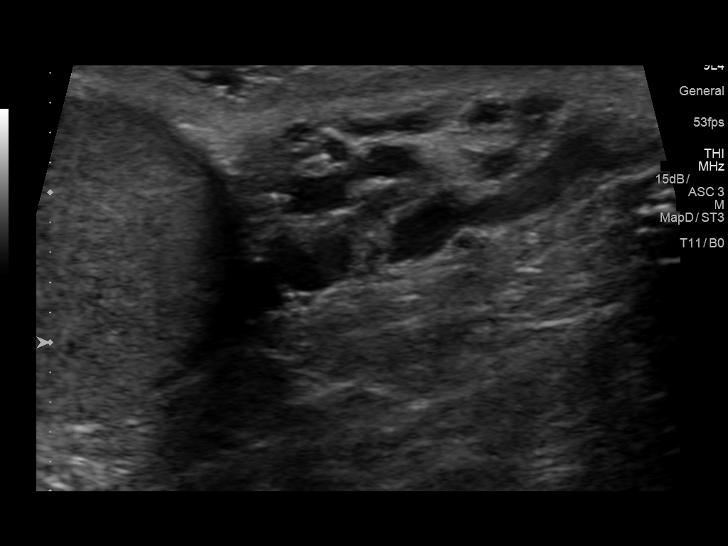
[im 46/62]
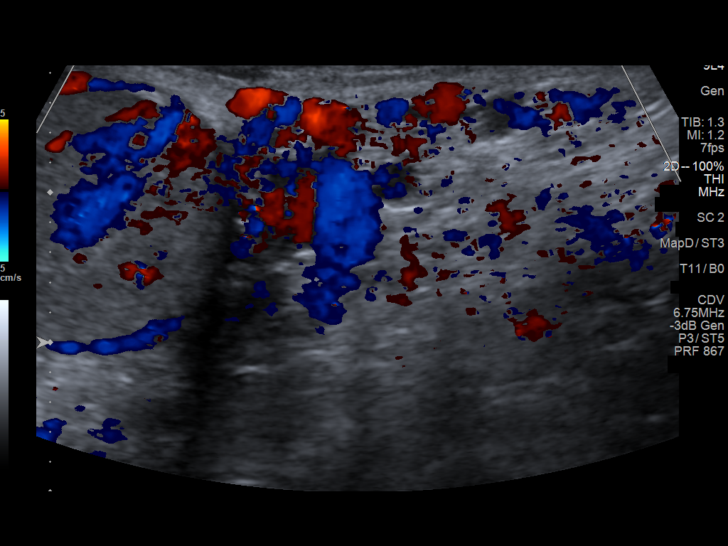
[im 51/62]
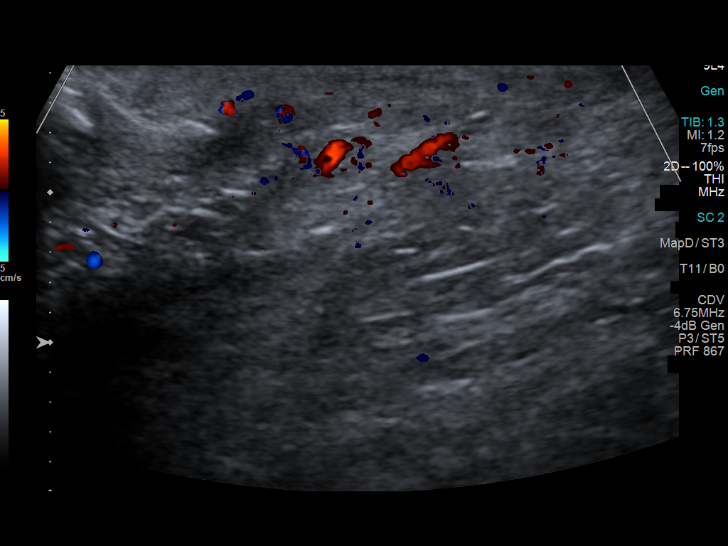
[im 56/62]
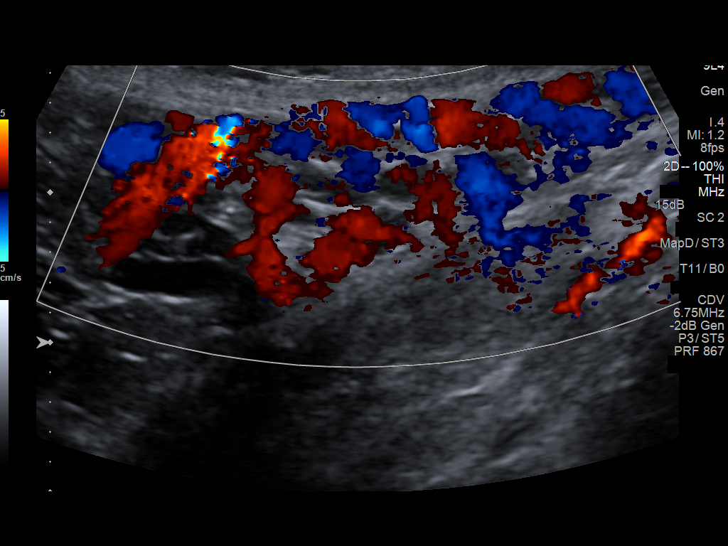
[im 62/62]
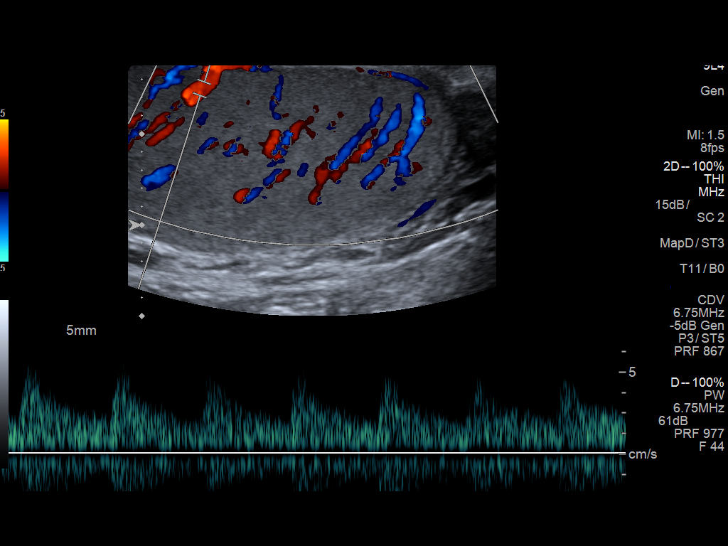

[14 of 25 positions shown; findings below may reference images not displayed]

FINDINGS: Right testicle

Measurements: 3.8 x 2.3 x 2.7 cm. No mass or microlithiasis
visualized.

Left testicle

Measurements: 3.8 x 2.1 x 2.8 cm. No mass or microlithiasis
visualized.

Right epididymis:  Normal in size and appearance.

Left epididymis:  Normal in size and appearance.

Hydrocele:  None visualized.

Varicocele:  Mild left varicocele noted.

Pulsed Doppler interrogation of both testes demonstrates normal low
resistance arterial and venous waveforms bilaterally.
IMPRESSION: 1. Mild left varicocele.
2. No other abnormalities. Normal testicles. No evidence of
testicular mass or torsion.

## 2020-11-24 ENCOUNTER — Other Ambulatory Visit: Payer: Self-pay

## 2020-11-24 ENCOUNTER — Emergency Department (HOSPITAL_BASED_OUTPATIENT_CLINIC_OR_DEPARTMENT_OTHER)
Admission: EM | Admit: 2020-11-24 | Discharge: 2020-11-24 | Disposition: A | Discharge status: Home / Self Care | Payer: Managed Care, Other (non HMO) | Attending: Emergency Medicine | Admitting: Emergency Medicine

## 2020-11-24 ENCOUNTER — Emergency Department (HOSPITAL_BASED_OUTPATIENT_CLINIC_OR_DEPARTMENT_OTHER): Payer: Managed Care, Other (non HMO)

## 2020-11-24 ENCOUNTER — Encounter (HOSPITAL_BASED_OUTPATIENT_CLINIC_OR_DEPARTMENT_OTHER): Payer: Self-pay | Admitting: *Deleted

## 2020-11-24 ENCOUNTER — Other Ambulatory Visit (HOSPITAL_BASED_OUTPATIENT_CLINIC_OR_DEPARTMENT_OTHER): Payer: Self-pay

## 2020-11-24 DIAGNOSIS — M25551 Pain in right hip: Secondary | ICD-10-CM | POA: Diagnosis not present

## 2020-11-24 DIAGNOSIS — F17210 Nicotine dependence, cigarettes, uncomplicated: Secondary | ICD-10-CM | POA: Diagnosis not present

## 2020-11-24 DIAGNOSIS — R1031 Right lower quadrant pain: Secondary | ICD-10-CM | POA: Diagnosis not present

## 2020-11-24 LAB — CBC
HCT: 45.9 % (ref 39.0–52.0)
Hemoglobin: 15.7 g/dL (ref 13.0–17.0)
MCH: 31.8 pg (ref 26.0–34.0)
MCHC: 34.2 g/dL (ref 30.0–36.0)
MCV: 93.1 fL (ref 80.0–100.0)
Platelets: 212 10*3/uL (ref 150–400)
RBC: 4.93 MIL/uL (ref 4.22–5.81)
RDW: 12.5 % (ref 11.5–15.5)
WBC: 5.7 10*3/uL (ref 4.0–10.5)
nRBC: 0 % (ref 0.0–0.2)

## 2020-11-24 LAB — URINALYSIS, ROUTINE W REFLEX MICROSCOPIC
Bilirubin Urine: NEGATIVE
Glucose, UA: NEGATIVE mg/dL
Hgb urine dipstick: NEGATIVE
Ketones, ur: NEGATIVE mg/dL
Leukocytes,Ua: NEGATIVE
Nitrite: NEGATIVE
Protein, ur: NEGATIVE mg/dL
Specific Gravity, Urine: 1.03 (ref 1.005–1.030)
pH: 5 (ref 5.0–8.0)

## 2020-11-24 LAB — COMPREHENSIVE METABOLIC PANEL
ALT: 27 U/L (ref 0–44)
AST: 21 U/L (ref 15–41)
Albumin: 4.2 g/dL (ref 3.5–5.0)
Alkaline Phosphatase: 66 U/L (ref 38–126)
Anion gap: 7 (ref 5–15)
BUN: 15 mg/dL (ref 6–20)
CO2: 26 mmol/L (ref 22–32)
Calcium: 9.3 mg/dL (ref 8.9–10.3)
Chloride: 106 mmol/L (ref 98–111)
Creatinine, Ser: 1.18 mg/dL (ref 0.61–1.24)
GFR, Estimated: >60 mL/min (ref >60)
Glucose, Bld: 97 mg/dL (ref 70–99)
Potassium: 4.2 mmol/L (ref 3.5–5.1)
Sodium: 139 mmol/L (ref 135–145)
Total Bilirubin: 0.5 mg/dL (ref 0.3–1.2)
Total Protein: 7.2 g/dL (ref 6.5–8.1)

## 2020-11-24 LAB — LIPASE, BLOOD: Lipase: 22 U/L (ref 11–51)

## 2020-11-24 MED ORDER — KETOROLAC TROMETHAMINE 15 MG/ML IJ SOLN
15.0000 mg | Freq: Once | INTRAMUSCULAR | Status: AC
  Administered 2020-11-24: 15 mg via INTRAVENOUS
  Filled 2020-11-24: qty 1

## 2020-11-24 MED ORDER — NAPROXEN 500 MG PO TABS
500.0000 mg | ORAL_TABLET | Freq: Two times a day (BID) | ORAL | 0 refills | Status: AC
Start: 2020-11-24 — End: 2020-12-08
  Filled 2020-11-24: qty 28, 14d supply, fill #0

## 2020-11-24 MED ORDER — IOHEXOL 350 MG/ML SOLN
100.0000 mL | Freq: Once | INTRAVENOUS | Status: AC | PRN
  Administered 2020-11-24: 85 mL via INTRAVENOUS

## 2020-11-24 NOTE — ED Notes (Signed)
Pt ambulated to bathroom with steady gait. 

## 2020-11-24 NOTE — ED Provider Notes (Signed)
[facility] EMERGENCY DEPARTMENT Provider Note   CSN: [id number] Arrival date & time: 11/24/20  1254     History Chief Complaint  Patient presents with   Abdominal Pain    Gary Burton is a 45 y.o. male.  Patient has no notable past medical history.  Once after 2 months of right lower quadrant abdominal pain.  He says that the pain is worse with use while he is running around playing softball.  He describes it as shooting pain.  It does not hurt when he is laying.  He is concerned it could be his appendix or his hip.  He has been using for Tylenol as at one time with no relief of his pain.  Of note, he has noted some blood in his stool that is deep red.  This is not very often usually with stools where he is straining very hard.  He denies any back pain, trauma to the area, constipation, diarrhea, chest pain, upper abdominal pain, shortness of breath.   Abdominal Pain Associated symptoms: no chest pain, no chills, no constipation, no cough, no diarrhea, no fever, no nausea, no shortness of breath and no vomiting       History reviewed. No pertinent past medical history.  Patient Active Problem List   Diagnosis Date Noted   Pain in left testicle 02/01/2017   Fatigue 02/01/2017    Past Surgical History:  Procedure Laterality Date   ANKLE SURGERY  2015   left ankle        History reviewed. No pertinent family history.  Social History   Tobacco Use   Smoking status: Every Day    Packs/day: 0.50    Types: Cigarettes   Smokeless tobacco: Current  Vaping Use   Vaping Use: Every day  Substance Use Topics   Alcohol use: Yes    Alcohol/week: 12.0 standard drinks    Types: 12 Cans of beer per week   Drug use: No    Home Medications Prior to Admission medications   Medication Sig Start Date End Date Taking? Authorizing Provider  naproxen (NAPROSYN) 500 MG tablet Take 1 tablet (500 mg total) by mouth 2 (two) times daily for 14 days. 11/24/20 12/08/20 Yes  Tali Cleaves, Finis Bud, PA-C    Allergies    Amoxapine and related  Review of Systems   Review of Systems  Constitutional:  Negative for chills and fever.  HENT:  Negative for congestion and rhinorrhea.   Eyes:  Negative for visual disturbance.  Respiratory:  Negative for cough, chest tightness and shortness of breath.   Cardiovascular:  Negative for chest pain, palpitations and leg swelling.  Gastrointestinal:  Positive for abdominal pain and blood in stool. Negative for constipation, diarrhea, nausea and vomiting.  Genitourinary:  Negative for difficulty urinating and flank pain.  Musculoskeletal:  Positive for arthralgias. Negative for back pain.  Skin:  Negative for rash and wound.  Neurological:  Negative for dizziness, syncope, weakness, light-headedness and headaches.  All other systems reviewed and are negative.  Physical Exam Updated Vital Signs BP 122/90 (BP Location: Right Arm)   Pulse (!) 48   Temp 98.6 F (37 C) (Oral)   Resp 18   Ht 5\' 10"  (1.778 m)   Wt 95.3 kg   SpO2 98%   BMI 30.13 kg/m   Physical Exam Vitals and nursing note reviewed.  Constitutional:      General: He is not in acute distress.    Appearance: Normal appearance. He is  not ill-appearing, toxic-appearing or diaphoretic.  HENT:     Head: Normocephalic and atraumatic.     Mouth/Throat:     Mouth: Mucous membranes are moist.     Pharynx: Oropharynx is clear. No oropharyngeal exudate or posterior oropharyngeal erythema.  Eyes:     General: No scleral icterus.       Right eye: No discharge.        Left eye: No discharge.     Conjunctiva/sclera: Conjunctivae normal.     Pupils: Pupils are equal, round, and reactive to light.  Cardiovascular:     Rate and Rhythm: Normal rate and regular rhythm.     Pulses: Normal pulses.     Heart sounds: Normal heart sounds, S1 normal and S2 normal. No murmur heard.   No friction rub. No gallop.  Pulmonary:     Effort: Pulmonary effort is normal. No  respiratory distress.     Breath sounds: Normal breath sounds. No wheezing, rhonchi or rales.  Abdominal:     General: Abdomen is flat. Bowel sounds are normal. There is no distension.     Palpations: Abdomen is soft. There is no pulsatile mass.     Tenderness: There is abdominal tenderness in the right lower quadrant and suprapubic area. There is no guarding or rebound.     Hernia: No hernia is present.  Musculoskeletal:     Right hip: Normal range of motion.     Left hip: Normal range of motion.     Right lower leg: No edema.     Left lower leg: No edema.     Comments: Tenderness to right hip with extension.   Skin:    General: Skin is warm and dry.     Coloration: Skin is not jaundiced.     Findings: No bruising, erythema, lesion or rash.  Neurological:     General: No focal deficit present.     Mental Status: He is alert and oriented to person, place, and time.  Psychiatric:        Mood and Affect: Mood normal.        Behavior: Behavior normal.    ED Results / Procedures / Treatments   Labs (all labs ordered are listed, but only abnormal results are displayed) Labs Reviewed  LIPASE, BLOOD  COMPREHENSIVE METABOLIC PANEL  CBC  URINALYSIS, ROUTINE W REFLEX MICROSCOPIC    EKG None  Radiology CT Abdomen Pelvis W Contrast  Result Date: 11/24/2020 CLINICAL DATA:  Right groin pain when walking for 2-3 weeks. EXAM: CT ABDOMEN AND PELVIS WITH CONTRAST TECHNIQUE: Multidetector CT imaging of the abdomen and pelvis was performed using the standard protocol following bolus administration of intravenous contrast. CONTRAST:  15mL OMNIPAQUE IOHEXOL 350 MG/ML SOLN COMPARISON:  None. FINDINGS: Lower chest: Clear lung bases. Normal heart size without pericardial or pleural effusion. Hepatobiliary: Normal liver. Normal gallbladder, without biliary ductal dilatation. Pancreas: Normal, without mass or ductal dilatation. Spleen: Normal in size, without focal abnormality. Adrenals/Urinary Tract:  Normal adrenal glands. Normal kidneys, without hydronephrosis. Normal urinary bladder. Stomach/Bowel: Normal stomach, without wall thickening. Scattered colonic diverticula. Normal terminal ileum and appendix. Normal small bowel. Vascular/Lymphatic: Aortic atherosclerosis. No abdominopelvic adenopathy. Reproductive: Normal prostate. Other: No significant free fluid. No groin hernias. Tiny periumbilical fat containing ventral wall hernia. Musculoskeletal: Degenerative changes of the symphysis pubis. Transitional vertebral body which will be presumed L5 based on diminutive twelfth ribs. Prominent disc bulges including at L3-4 and L4-5. IMPRESSION: 1. No acute process in the abdomen or  pelvis. No explanation for right-sided pain. 2.  Aortic Atherosclerosis (ICD10-I70.0). Electronically Signed   By: Jeronimo Greaves M.D.   On: 11/24/2020 15:49    Procedures Procedures   Medications Ordered in ED Medications  iohexol (OMNIPAQUE) 350 MG/ML injection 100 mL (85 mLs Intravenous Contrast Given 11/24/20 1507)  ketorolac (TORADOL) 15 MG/ML injection 15 mg (15 mg Intravenous Given 11/24/20 1527)    ED Course  I have reviewed the triage vital signs and the nursing notes.  Pertinent labs & imaging results that were available during my care of the patient were reviewed by me and considered in my medical decision making (see chart for details).    MDM Rules/Calculators/A&P                          This is a well-appearing 45 year old male with no notable past medical history.  He presents with chronic right lower quadrant abdominal pain and right hip pain.  Vitals are stable.  Exam with mild tenderness to right lower quadrant and suprapubic abdomen.  Tenderness to anterior right hip with extension of right hip.  Given chronic nature with lack of systemic symptoms I do not suspect that patient has an acute abdominal process such as appendicitis.  This is likely musculoskeletal or due to a hernia.  CT abdomen pelvis was  ordered in order to rule out any acute causes to patient's pain.   CT with no acute findings. Degenerative changes to the symphysis pubis which may be indicative of arthritis. Reevaluated patient and pain has been controlled with Toradol. Spoke with patient regarding findings. I will send him home with two weeks of antiinflammatories. He is to set up an appointment with [facility] I also provided him information for a sports medicine provider. He is stable for discharge.        Final Clinical Impression(s) / ED Diagnoses Final diagnoses:  Right hip pain    Rx / DC Orders ED Discharge Orders          Ordered    naproxen (NAPROSYN) 500 MG tablet  2 times daily        11/24/20 1615             Shakena Callari, Finis Bud, PA-C 11/24/20 1620    Virgina Norfolk, DO 11/25/20 [phone]

## 2020-11-24 NOTE — Discharge Instructions (Addendum)
Your examination today is most concerning for a muscular injury 1. Medications: alternate Naproxen and tylenol for pain control, take all usual home medications as they are prescribed 2. Treatment: rest, ice, elevate and use an ACE wrap or other compressive therapy to decrease swelling. Also drink plenty of fluids and do plenty of gentle stretching and move the affected muscle through its normal range of motion to prevent stiffness. 3. Follow Up: If your symptoms do not improve please follow up with orthopedics/sports medicine or your PCP for discussion of your diagnoses and further evaluation after today's visit; if you do not have a primary care doctor use the resource guide provided to find one; Please return to the ER for worsening symptoms or other concerns.   Please schedule an appointment with the [facility] to establish care.   I have also provided a phone number for a sports medicine provider who may be able to help with your symptoms.

## 2020-11-24 NOTE — ED Notes (Signed)
Patient transported to CT 

## 2020-11-24 NOTE — ED Triage Notes (Signed)
2 months of periumbilical pain that comes and goes.

## 2021-05-11 ENCOUNTER — Ambulatory Visit (INDEPENDENT_AMBULATORY_CARE_PROVIDER_SITE_OTHER): Payer: Managed Care, Other (non HMO) | Admitting: Nurse Practitioner

## 2021-05-11 ENCOUNTER — Encounter: Payer: Self-pay | Admitting: Nurse Practitioner

## 2021-05-11 ENCOUNTER — Other Ambulatory Visit: Payer: Self-pay

## 2021-05-11 DIAGNOSIS — R051 Acute cough: Secondary | ICD-10-CM

## 2021-05-11 DIAGNOSIS — J069 Acute upper respiratory infection, unspecified: Secondary | ICD-10-CM

## 2021-05-11 MED ORDER — PROMETHAZINE-DM 6.25-15 MG/5ML PO SYRP: 5.0000 mL | ORAL_SOLUTION | Freq: Four times a day (QID) | ORAL | 0 refills | Status: AC | PRN

## 2021-05-11 MED ORDER — AZITHROMYCIN 250 MG PO TABS: ORAL_TABLET | ORAL | 0 refills | Status: AC

## 2021-05-11 MED ORDER — BENZONATATE 100 MG PO CAPS: 100.0000 mg | ORAL_CAPSULE | Freq: Three times a day (TID) | ORAL | 0 refills | Status: AC | PRN

## 2021-05-11 NOTE — Progress Notes (Signed)
Virtual Visit via Telephone Note  I connected with Gary Burton on 05/11/21 at  1:00 PM EST by telephone and verified that I am speaking with the correct person using two identifiers.  Location: Patient: home Provider: office   I discussed the limitations, risks, security and privacy concerns of performing an evaluation and management service by telephone and the availability of in person appointments. I also discussed with the patient that there may be a patient responsible charge related to this service. The patient expressed understanding and agreed to proceed.   History of Present Illness:  Patient presents today for sick visit through telephone visit.  Patient states that his symptoms started 2 weeks ago.  He states that he has been having cough and chest congestion.  Congestion.  He does he does cough up green mucus at times mucus at times.  He states that he does he does have a sore throat at times.  Denies f/c/s, n/v/d, hemoptysis, PND, chest pain or edema.     Observations/Objective:  Vitals with BMI 11/24/2020 11/24/2020 11/24/2020  Height - - -  Weight - - -  BMI - - -  Systolic 122 136 335  Diastolic 90 86 92  Pulse 48 49 53      Assessment and Plan:  1. Upper respiratory tract infection, unspecified type - azithromycin (ZITHROMAX) 250 MG tablet; Take 2 tablets on day 1, then 1 tablet daily on days 2 through 5  Dispense: 6 tablet; Refill: 0 - benzonatate (TESSALON) 100 MG capsule; Take 1 capsule (100 mg total) by mouth 3 (three) times daily as needed for cough.  Dispense: 20 capsule; Refill: 0 - promethazine-dextromethorphan (PROMETHAZINE-DM) 6.25-15 MG/5ML syrup; Take 5 mLs by mouth 4 (four) times daily as needed for cough.  Dispense: 118 mL; Refill: 0  2. Acute cough - azithromycin (ZITHROMAX) 250 MG tablet; Take 2 tablets on day 1, then 1 tablet daily on days 2 through 5  Dispense: 6 tablet; Refill: 0 - benzonatate (TESSALON) 100 MG capsule; Take 1 capsule (100 mg  total) by mouth 3 (three) times daily as needed for cough.  Dispense: 20 capsule; Refill: 0 - promethazine-dextromethorphan (PROMETHAZINE-DM) 6.25-15 MG/5ML syrup; Take 5 mLs by mouth 4 (four) times daily as needed for cough.  Dispense: 118 mL; Refill: 0  Concerned for bronchitis vs atypical pneumonia (green sputum noted)  Will order:  Meds ordered this encounter  Medications   azithromycin (ZITHROMAX) 250 MG tablet    Sig: Take 2 tablets on day 1, then 1 tablet daily on days 2 through 5    Dispense:  6 tablet    Refill:  0   benzonatate (TESSALON) 100 MG capsule    Sig: Take 1 capsule (100 mg total) by mouth 3 (three) times daily as needed for cough.    Dispense:  20 capsule    Refill:  0   promethazine-dextromethorphan (PROMETHAZINE-DM) 6.25-15 MG/5ML syrup    Sig: Take 5 mLs by mouth 4 (four) times daily as needed for cough.    Dispense:  118 mL    Refill:  0      Stay well hydrated  Stay active  Deep breathing exercises  May take tylenol for fever or pain    Follow up:  Follow up if needed      I discussed the assessment and treatment plan with the patient. The patient was provided an opportunity to ask questions and all were answered. The patient agreed with the plan and demonstrated an  understanding of the instructions.   The patient was advised to call back or seek an in-person evaluation if the symptoms worsen or if the condition fails to improve as anticipated.  I provided 23 minutes of non-face-to-face time during this encounter.   Ivonne Andrew, NP

## 2021-05-11 NOTE — Progress Notes (Signed)
Patient verified DOB Patient reports Sx beginning last night with a cough with green mucous. Patient reports HA located in the forehead like an ache. Patient reports nasal congestion with clear drainage. Patient reports being on a diet and limiting intake. Patient has not used medications today. Patient has not eaten today. Patient was able to drink water. Patient reports cough woke him up twice last night. Patients wife was sick and taking cough medication. Patient has not done a home COVID test.

## 2021-05-11 NOTE — Patient Instructions (Addendum)
URI Cough:  Concerned for bronchitis vs atypical pneumonia (green sputum noted)  Will order:  Meds ordered this encounter  Medications   azithromycin (ZITHROMAX) 250 MG tablet    Sig: Take 2 tablets on day 1, then 1 tablet daily on days 2 through 5    Dispense:  6 tablet    Refill:  0   benzonatate (TESSALON) 100 MG capsule    Sig: Take 1 capsule (100 mg total) by mouth 3 (three) times daily as needed for cough.    Dispense:  20 capsule    Refill:  0   promethazine-dextromethorphan (PROMETHAZINE-DM) 6.25-15 MG/5ML syrup    Sig: Take 5 mLs by mouth 4 (four) times daily as needed for cough.    Dispense:  118 mL    Refill:  0      Stay well hydrated  Stay active  Deep breathing exercises  May take tylenol for fever or pain    Follow up:  Follow up if needed

## 2022-06-15 ENCOUNTER — Ambulatory Visit: Payer: Managed Care, Other (non HMO)

## 2022-10-17 ENCOUNTER — Encounter (HOSPITAL_BASED_OUTPATIENT_CLINIC_OR_DEPARTMENT_OTHER): Payer: Self-pay

## 2022-10-17 ENCOUNTER — Emergency Department (HOSPITAL_BASED_OUTPATIENT_CLINIC_OR_DEPARTMENT_OTHER)
Admission: EM | Admit: 2022-10-17 | Discharge: 2022-10-17 | Disposition: A | Discharge status: Home / Self Care | Payer: Managed Care, Other (non HMO) | Attending: Emergency Medicine | Admitting: Emergency Medicine

## 2022-10-17 ENCOUNTER — Other Ambulatory Visit: Payer: Self-pay

## 2022-10-17 DIAGNOSIS — S61401A Unspecified open wound of right hand, initial encounter: Secondary | ICD-10-CM | POA: Insufficient documentation

## 2022-10-17 DIAGNOSIS — X58XXXA Exposure to other specified factors, initial encounter: Secondary | ICD-10-CM | POA: Insufficient documentation

## 2022-10-17 DIAGNOSIS — R21 Rash and other nonspecific skin eruption: Secondary | ICD-10-CM | POA: Insufficient documentation

## 2022-10-17 DIAGNOSIS — Z23 Encounter for immunization: Secondary | ICD-10-CM | POA: Diagnosis not present

## 2022-10-17 MED ORDER — BACITRACIN ZINC 500 UNIT/GM EX OINT: 1.0000 | TOPICAL_OINTMENT | Freq: Two times a day (BID) | CUTANEOUS | 0 refills | Status: AC

## 2022-10-17 MED ORDER — TETANUS-DIPHTH-ACELL PERTUSSIS 5-2.5-18.5 LF-MCG/0.5 IM SUSY
0.5000 mL | PREFILLED_SYRINGE | Freq: Once | INTRAMUSCULAR | Status: AC
  Administered 2022-10-17: 0.5 mL via INTRAMUSCULAR
  Filled 2022-10-17: qty 0.5

## 2022-10-17 MED ORDER — DOXYCYCLINE HYCLATE 100 MG PO CAPS: 100.0000 mg | ORAL_CAPSULE | Freq: Two times a day (BID) | ORAL | 0 refills | Status: AC

## 2022-10-17 NOTE — ED Triage Notes (Signed)
In for possible insect bite wound to right hand between middle and ring fingers onset 5 days ago. Pain and redness to area.

## 2022-10-17 NOTE — Discharge Instructions (Signed)
Stop using peroxide.  You can clean your hands with a mild soap such as Dove.  Take doxycycline as prescribed and complete the full course.  This medication can make you sun sensitive, wear sunscreen and light clothing if outside.  Apply bacitracin to wound as prescribed.  Follow-up with a primary care provider if needed.  Check with your insurance for an in network provider.

## 2022-10-17 NOTE — ED Provider Notes (Signed)
[place] EMERGENCY DEPARTMENT AT [facility] Provider Note   CSN: [id number] Arrival date & time: 10/17/22  1448     History  Chief Complaint  Patient presents with   Insect Bite    Gary Burton is a 47 y.o. male.  47 year old male presents with concern for infection to wound on his right hand.  Patient states that about 5 days ago he noticed a blister in between his right third and fourth fingers.  He popped it with a pen and has been cleaning the area daily with peroxide.  He now notes that there is a little bit of redness moving down his finger as well as a small sore on the palmar aspect of the digit.  He notes he also has a few bumps on his right forearm and a small wound to his left hand and left lower leg.  He states that he was running his hands between shingles when all of this started.  Last tetanus is unknown.  Denies fevers.       Home Medications Prior to Admission medications   Medication Sig Start Date End Date Taking? Authorizing Provider  bacitracin ointment Apply 1 Application topically 2 (two) times daily. 10/17/22  Yes Jeannie Fend, PA-C  doxycycline (VIBRAMYCIN) 100 MG capsule Take 1 capsule (100 mg total) by mouth 2 (two) times daily. 10/17/22  Yes Jeannie Fend, PA-C  benzonatate (TESSALON) 100 MG capsule Take 1 capsule (100 mg total) by mouth 3 (three) times daily as needed for cough. 05/11/21   Ivonne Andrew, NP  promethazine-dextromethorphan (PROMETHAZINE-DM) 6.25-15 MG/5ML syrup Take 5 mLs by mouth 4 (four) times daily as needed for cough. 05/11/21   Ivonne Andrew, NP      Allergies    Amoxapine and related    Review of Systems   Review of Systems Negative except as per HPI Physical Exam Updated Vital Signs BP (!) 136/98 (BP Location: Left Arm)   Pulse 63   Temp 97.8 F (36.6 C)   Resp 20   Ht 5\' 11"  (1.803 m)   Wt 99.8 kg   SpO2 99%   BMI 30.68 kg/m  Physical Exam Vitals and nursing note reviewed.  Constitutional:       General: He is not in acute distress.    Appearance: He is well-developed. He is not diaphoretic.  HENT:     Head: Normocephalic and atraumatic.  Pulmonary:     Effort: Pulmonary effort is normal.  Musculoskeletal:        General: Signs of injury present. No swelling, tenderness or deformity.  Skin:    General: Skin is warm and dry.     Findings: Rash present. No erythema.     Comments: Shallow ulcerated lesion noted to right hand dorsal aspect at webspace between 3rd and 4th fingers, negative for sausage digit, normal ROM, able to fully extend, no pain with palpation along flexor and extensor tendons.   Similar lesion noted to ulnar aspect of left hand. Abrasions noted to left 2nd/3rd MCPs  3-4 small, red, raised lesions noted to left forearm without vesicles.  Neurological:     Mental Status: He is alert and oriented to person, place, and time.  Psychiatric:        Behavior: Behavior normal.     ED Results / Procedures / Treatments   Labs (all labs ordered are listed, but only abnormal results are displayed) Labs Reviewed - No data to display  EKG None  Radiology No results found.  Procedures Procedures    Medications Ordered in ED Medications  Tdap (BOOSTRIX) injection 0.5 mL (has no administration in time range)    ED Course/ Medical Decision Making/ A&P                             Medical Decision Making Risk OTC drugs. Prescription drug management.   47 year old male presents with concern for infection in his right hand at sore noted between the third and fourth digits as above.  Concerned for redness spreading distally from the wound into the finger.  There is no significant erythema, no evidence of tenosynovitis.  Patient does report exposure to ticks although there is no evidence of EM rash.  Recommend he discontinue using peroxide.  Encouraged to use a mild soap and apply bacitracin.  Given tick exposure and concerns, can cover with doxycycline  although discussed sun exposure risks and precautions.  Tetanus updated today.        Final Clinical Impression(s) / ED Diagnoses Final diagnoses:  Open wound of right hand without foreign body, unspecified wound type, initial encounter  Rash    Rx / DC Orders ED Discharge Orders          Ordered    doxycycline (VIBRAMYCIN) 100 MG capsule  2 times daily        10/17/22 1542    bacitracin ointment  2 times daily        10/17/22 1542              Jeannie Fend, PA-C 10/17/22 1554    Sloan Leiter, DO 10/17/22 2332

## 2023-06-14 IMAGING — CT CT ABD-PELV W/ CM
2 of 5 series · 16 of 46 positions shown, 18 images · IV contrast (omnipaque)
Comparison: None.

CLINICAL DATA: Right groin pain when walking for 2-3 weeks.

EXAM:
CT ABDOMEN AND PELVIS WITH CONTRAST
TECHNIQUE: Multidetector CT imaging of the abdomen and pelvis was performed
using the standard protocol following bolus administration of
intravenous contrast.
CONTRAST:  85mL OMNIPAQUE IOHEXOL 350 MG/ML SOLN

[Series 2: axial st · axial · 0.96mm/px · z∈[-542,-42]mm · 13 of 112 slices shown, 15 images]
[im 6/112  soft-tissue]
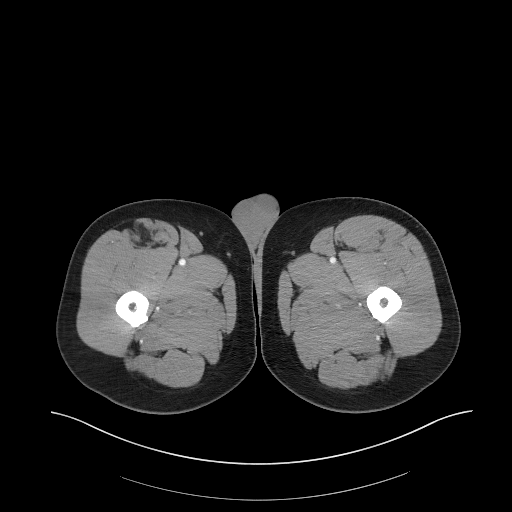
[im 6/112  bone]
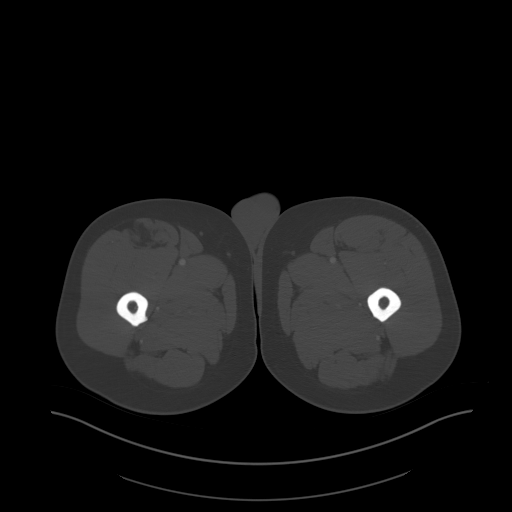
[im 16/112  soft-tissue]
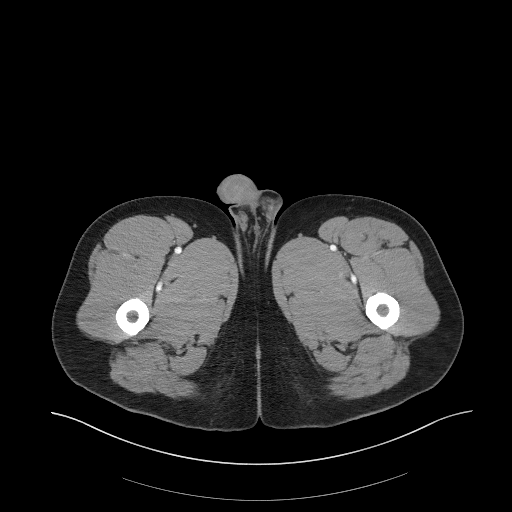
[im 22/112  soft-tissue]
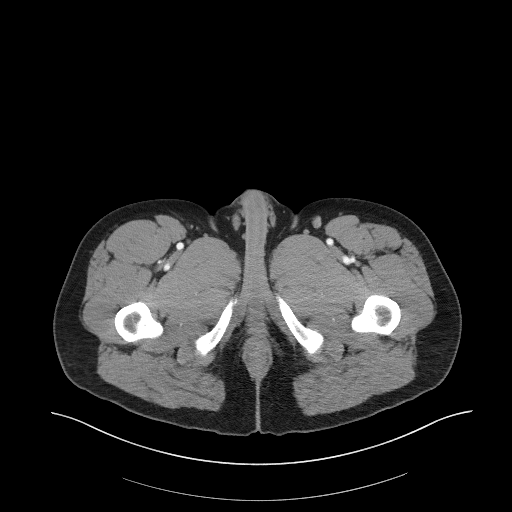
[im 32/112  soft-tissue]
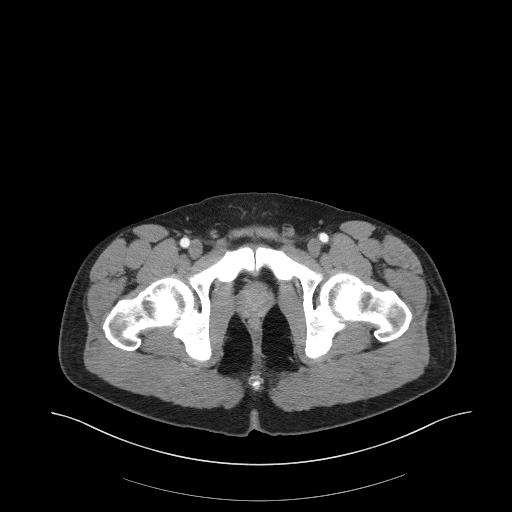
[im 38/112  soft-tissue]
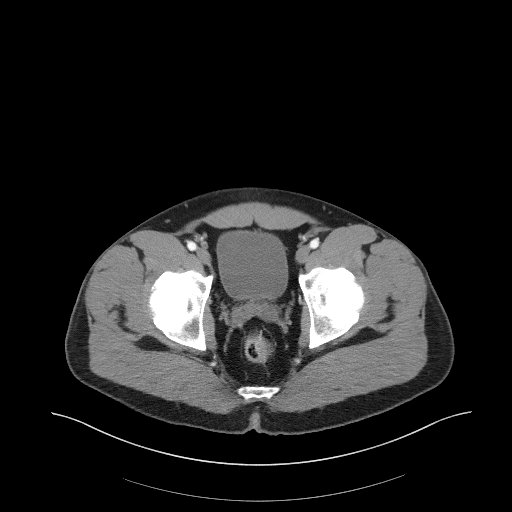
[im 48/112  soft-tissue]
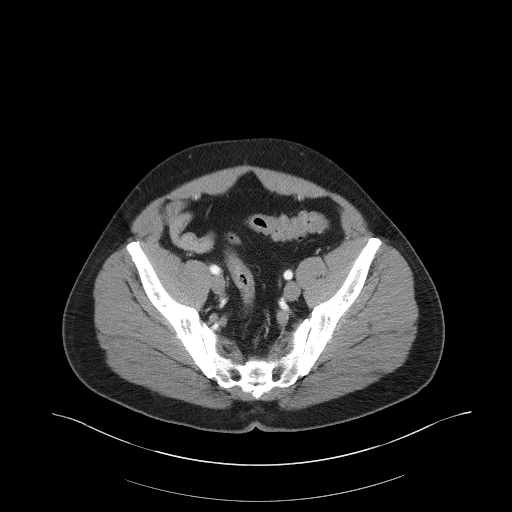
[im 59/112  soft-tissue]
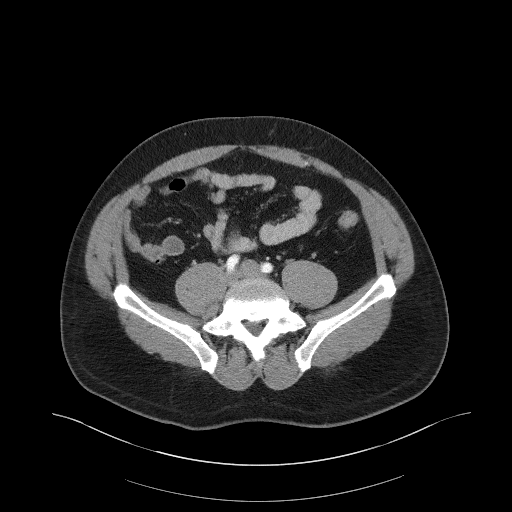
[im 64/112  soft-tissue]
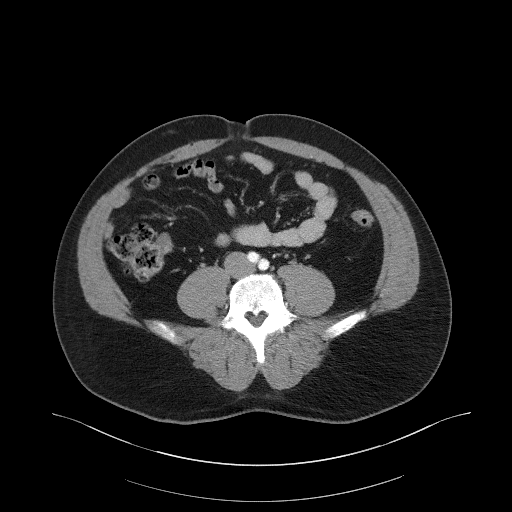
[im 75/112  soft-tissue]
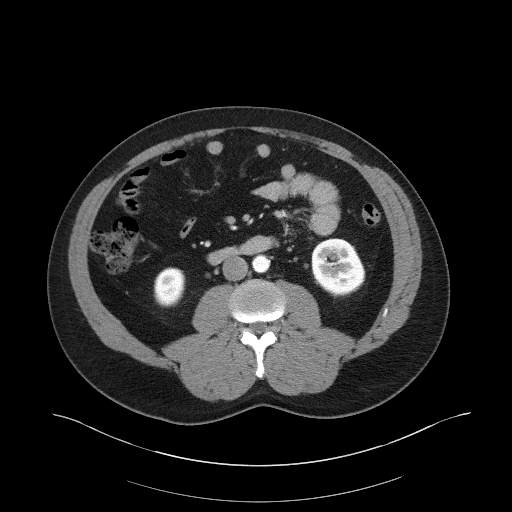
[im 75/112  bone]
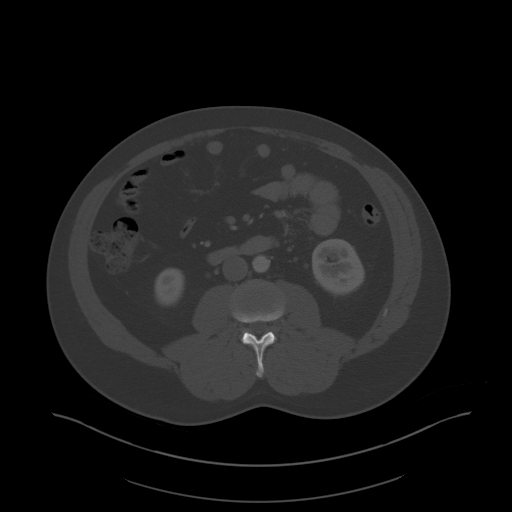
[im 80/112  soft-tissue]
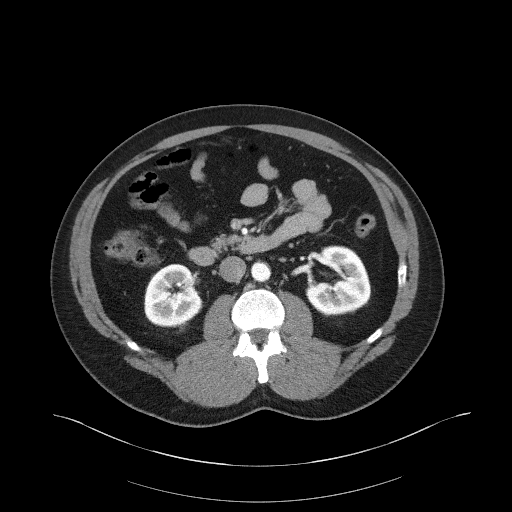
[im 90/112  soft-tissue]
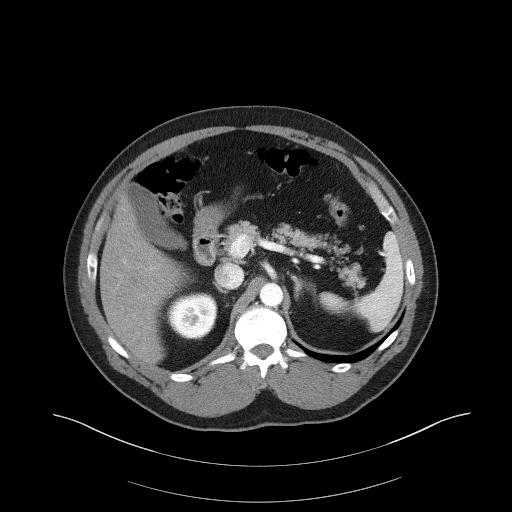
[im 96/112  soft-tissue]
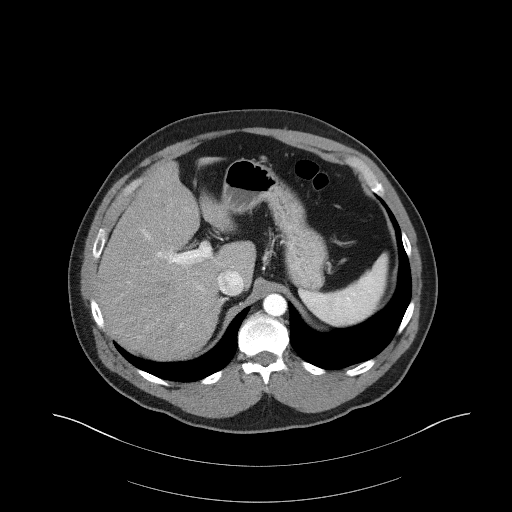
[im 106/112  soft-tissue]
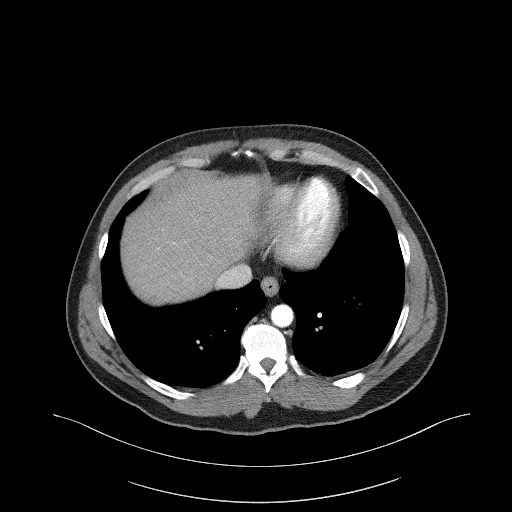

[Series 5: coronal st · coronal · 0.79mm/px · 3 of 106 slices shown]
[im 36/106  soft-tissue]
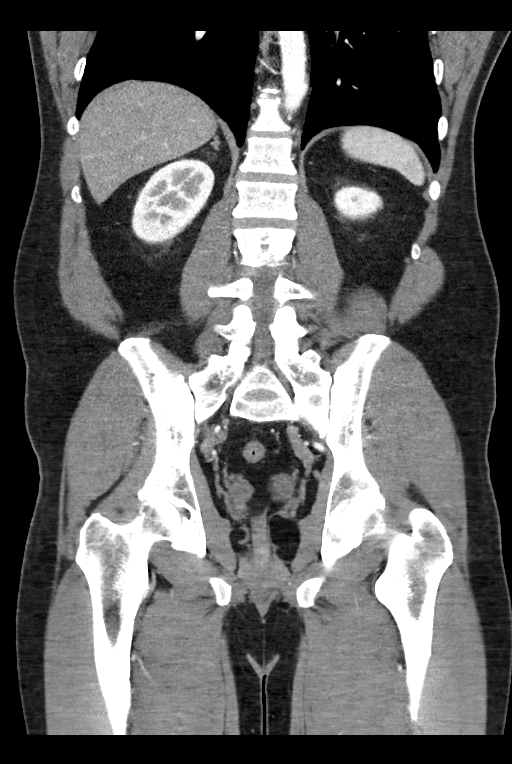
[im 47/106  soft-tissue]
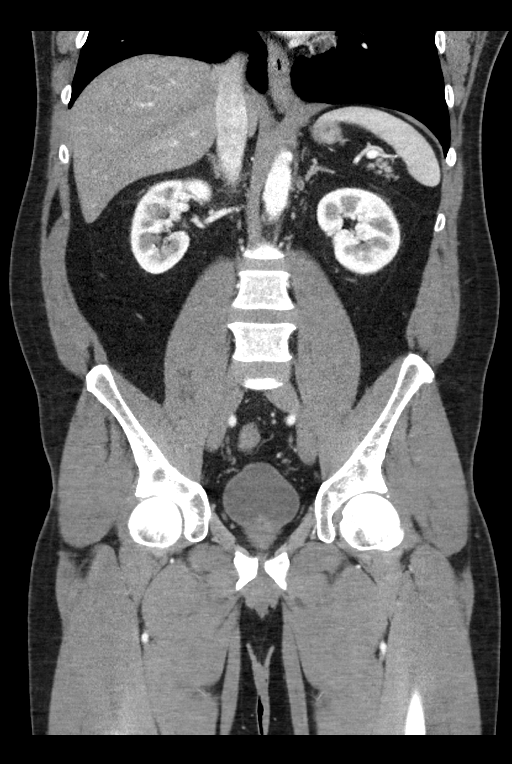
[im 59/106  soft-tissue]
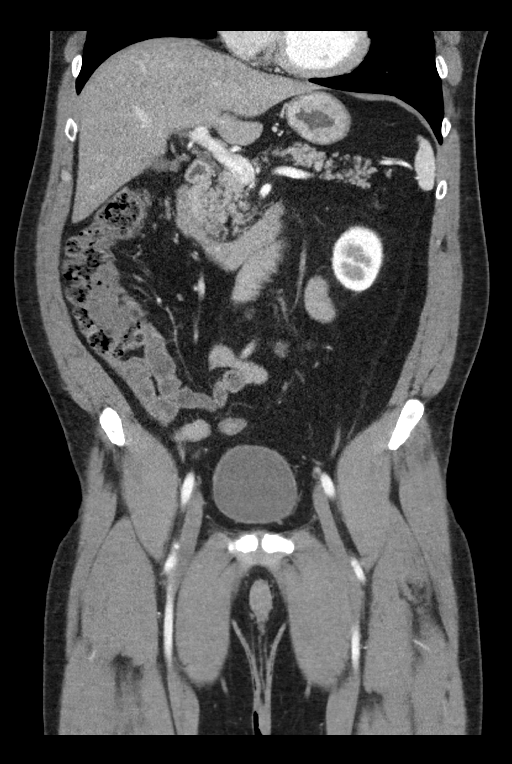

[16 of 46 positions shown; findings below may reference images not displayed]

FINDINGS: Lower chest: Clear lung bases. Normal heart size without pericardial
or pleural effusion.

Hepatobiliary: Normal liver. Normal gallbladder, without biliary
ductal dilatation.

Pancreas: Normal, without mass or ductal dilatation.

Spleen: Normal in size, without focal abnormality.

Adrenals/Urinary Tract: Normal adrenal glands. Normal kidneys,
without hydronephrosis. Normal urinary bladder.

Stomach/Bowel: Normal stomach, without wall thickening. Scattered
colonic diverticula. Normal terminal ileum and appendix. Normal
small bowel.

Vascular/Lymphatic: Aortic atherosclerosis. No abdominopelvic
adenopathy.

Reproductive: Normal prostate.

Other: No significant free fluid. No groin hernias. Tiny
periumbilical fat containing ventral wall hernia.

Musculoskeletal: Degenerative changes of the symphysis pubis.
Transitional vertebral body which will be presumed L5 based on
diminutive twelfth ribs. Prominent disc bulges including at L3-4 and
L4-5.
IMPRESSION: 1. No acute process in the abdomen or pelvis. No explanation for
right-sided pain.
2.  Aortic Atherosclerosis ([id number]).
